# Patient Record
Sex: Female | Born: 1950 | Race: Black or African American | Hispanic: No | Smoking: Never smoker
Health system: Southern US, Community
[De-identification: ages and names within clinical notes are randomized; demographics above are authoritative.]

## PROBLEM LIST (undated history)

## (undated) DIAGNOSIS — G4730 Sleep apnea, unspecified: Secondary | ICD-10-CM

## (undated) DIAGNOSIS — F329 Major depressive disorder, single episode, unspecified: Secondary | ICD-10-CM

## (undated) DIAGNOSIS — F32A Depression, unspecified: Secondary | ICD-10-CM

## (undated) DIAGNOSIS — G709 Myoneural disorder, unspecified: Secondary | ICD-10-CM

## (undated) DIAGNOSIS — F419 Anxiety disorder, unspecified: Secondary | ICD-10-CM

## (undated) DIAGNOSIS — F1910 Other psychoactive substance abuse, uncomplicated: Secondary | ICD-10-CM

## (undated) DIAGNOSIS — I10 Essential (primary) hypertension: Secondary | ICD-10-CM

## (undated) DIAGNOSIS — B958 Unspecified staphylococcus as the cause of diseases classified elsewhere: Secondary | ICD-10-CM

## (undated) DIAGNOSIS — H269 Unspecified cataract: Secondary | ICD-10-CM

## (undated) DIAGNOSIS — K219 Gastro-esophageal reflux disease without esophagitis: Secondary | ICD-10-CM

## (undated) DIAGNOSIS — M1990 Unspecified osteoarthritis, unspecified site: Secondary | ICD-10-CM

## (undated) DIAGNOSIS — N730 Acute parametritis and pelvic cellulitis: Secondary | ICD-10-CM

## (undated) DIAGNOSIS — K759 Inflammatory liver disease, unspecified: Secondary | ICD-10-CM

## (undated) HISTORY — DX: Myoneural disorder, unspecified: G70.9

## (undated) HISTORY — PX: POLYPECTOMY: SHX149

## (undated) HISTORY — PX: CHOLECYSTECTOMY: SHX55

## (undated) HISTORY — DX: Sleep apnea, unspecified: G47.30

## (undated) HISTORY — DX: Depression, unspecified: F32.A

## (undated) HISTORY — PX: APPENDECTOMY: SHX54

## (undated) HISTORY — DX: Other psychoactive substance abuse, uncomplicated: F19.10

## (undated) HISTORY — DX: Gastro-esophageal reflux disease without esophagitis: K21.9

## (undated) HISTORY — DX: Inflammatory liver disease, unspecified: K75.9

## (undated) HISTORY — DX: Unspecified cataract: H26.9

## (undated) HISTORY — DX: Anxiety disorder, unspecified: F41.9

## (undated) HISTORY — PX: OTHER SURGICAL HISTORY: SHX169

## (undated) HISTORY — PX: COLONOSCOPY: SHX174

## (undated) HISTORY — PX: CYST REMOVAL NECK: SHX6281

## (undated) HISTORY — DX: Unspecified staphylococcus as the cause of diseases classified elsewhere: B95.8

## (undated) HISTORY — DX: Unspecified osteoarthritis, unspecified site: M19.90

---
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
## 2010-10-17 ENCOUNTER — Inpatient Hospital Stay (HOSPITAL_COMMUNITY): Admission: EM | Admit: 2010-10-17 | Discharge: 2010-10-18 | Payer: Self-pay | Admitting: Emergency Medicine

## 2010-10-17 ENCOUNTER — Encounter (INDEPENDENT_AMBULATORY_CARE_PROVIDER_SITE_OTHER): Payer: Self-pay

## 2011-01-30 LAB — DIFFERENTIAL
Basophils Absolute: 0 10*3/uL (ref 0.0–0.1)
Eosinophils Relative: 0 % (ref 0–5)
Lymphocytes Relative: 12 % (ref 12–46)
Lymphs Abs: 1.9 10*3/uL (ref 0.7–4.0)
Neutro Abs: 12.9 10*3/uL — ABNORMAL HIGH (ref 1.7–7.7)

## 2011-01-30 LAB — URINALYSIS, ROUTINE W REFLEX MICROSCOPIC
Bilirubin Urine: NEGATIVE
Hgb urine dipstick: NEGATIVE
Ketones, ur: NEGATIVE mg/dL
Nitrite: NEGATIVE
Urobilinogen, UA: 0.2 mg/dL (ref 0.0–1.0)
pH: 8.5 — ABNORMAL HIGH (ref 5.0–8.0)

## 2011-01-30 LAB — POCT I-STAT, CHEM 8
BUN: 11 mg/dL (ref 6–23)
Chloride: 105 mEq/L (ref 96–112)
Creatinine, Ser: 0.8 mg/dL (ref 0.4–1.2)
Potassium: 4 mEq/L (ref 3.5–5.1)
Sodium: 138 mEq/L (ref 135–145)
TCO2: 28 mmol/L (ref 0–100)

## 2011-01-30 LAB — CBC
MCV: 84.7 fL (ref 78.0–100.0)
Platelets: 276 10*3/uL (ref 150–400)
RBC: 4.65 MIL/uL (ref 3.87–5.11)
WBC: 15.6 10*3/uL — ABNORMAL HIGH (ref 4.0–10.5)

## 2011-01-30 LAB — URINE MICROSCOPIC-ADD ON

## 2011-10-30 ENCOUNTER — Emergency Department (HOSPITAL_COMMUNITY): Payer: Self-pay

## 2011-10-30 ENCOUNTER — Emergency Department (HOSPITAL_COMMUNITY)
Admission: EM | Admit: 2011-10-30 | Discharge: 2011-10-30 | Disposition: A | Discharge status: Home / Self Care | Payer: Self-pay | Attending: Emergency Medicine | Admitting: Emergency Medicine

## 2011-10-30 ENCOUNTER — Encounter: Payer: Self-pay | Admitting: *Deleted

## 2011-10-30 DIAGNOSIS — R946 Abnormal results of thyroid function studies: Secondary | ICD-10-CM | POA: Insufficient documentation

## 2011-10-30 DIAGNOSIS — X500XXA Overexertion from strenuous movement or load, initial encounter: Secondary | ICD-10-CM | POA: Insufficient documentation

## 2011-10-30 DIAGNOSIS — J45909 Unspecified asthma, uncomplicated: Secondary | ICD-10-CM | POA: Insufficient documentation

## 2011-10-30 DIAGNOSIS — M79609 Pain in unspecified limb: Secondary | ICD-10-CM | POA: Insufficient documentation

## 2011-10-30 DIAGNOSIS — I10 Essential (primary) hypertension: Secondary | ICD-10-CM | POA: Insufficient documentation

## 2011-10-30 DIAGNOSIS — S76019A Strain of muscle, fascia and tendon of unspecified hip, initial encounter: Secondary | ICD-10-CM

## 2011-10-30 DIAGNOSIS — IMO0002 Reserved for concepts with insufficient information to code with codable children: Secondary | ICD-10-CM | POA: Insufficient documentation

## 2011-10-30 HISTORY — DX: Essential (primary) hypertension: I10

## 2011-10-30 HISTORY — DX: Acute parametritis and pelvic cellulitis: N73.0

## 2011-10-30 MED ORDER — METHOCARBAMOL 500 MG PO TABS: 500.0000 mg | ORAL_TABLET | Freq: Two times a day (BID) | ORAL | Status: AC | PRN

## 2011-10-30 MED ORDER — KETOROLAC TROMETHAMINE 60 MG/2ML IM SOLN
60.0000 mg | Freq: Once | INTRAMUSCULAR | Status: AC
Start: 2011-10-30 — End: 2011-10-30
  Administered 2011-10-30: 60 mg via INTRAMUSCULAR
  Filled 2011-10-30: qty 2

## 2011-10-30 MED ORDER — NAPROXEN 500 MG PO TABS: 500.0000 mg | ORAL_TABLET | Freq: Two times a day (BID) | ORAL | Status: AC

## 2011-10-30 NOTE — ED Notes (Signed)
Pt. Discharged to home via w/c pt. Alert and oriented, NAD noted 

## 2011-10-30 NOTE — ED Provider Notes (Signed)
History     CSN: [id number] Arrival date & time: 10/30/2011  1:20 AM   First MD Initiated Contact with Patient 10/30/11 0203      Chief Complaint  Patient presents with  . Hip Pain    injury from different position during activity.     (Consider location/radiation/quality/duration/timing/severity/associated sxs/prior treatment) HPI Comments: 60 year old female with a history of right hip pain. This started approximately 3 days ago as the patient was trying out a "new sexual position". She states that her left leg was on the bed her right foot was on the floor and while she was bending over she felt acute onset of right thigh pain located in the right inguinal area radiating to her right anterior leg. Symptoms are constant, worse with moving the leg, better when holding her leg still. She has tried over-the-counter medications without any relief. She is walking with a cane. Symptoms are moderate, not associated with numbness or weakness, worse with ambulation. They were acute in onset  The history is provided by the patient.    Past Medical History  Diagnosis Date  . Hypertension   . Asthma   . PID (acute pelvic inflammatory disease)     Past Surgical History  Procedure Date  . Appendectomy   . Cholecystectomy   . Cesarean section     Family History  Problem Relation Age of Onset  . Stroke Mother   . Hypertension Mother   . Diabetes Other     History  Substance Use Topics  . Smoking status: Never Smoker   . Smokeless tobacco: Not on file  . Alcohol Use: Yes     rare/social    OB History    Grav Para Term Preterm Abortions TAB SAB Ect Mult Living                  Review of Systems  Gastrointestinal: Negative for nausea and vomiting.  Musculoskeletal: Positive for gait problem. Negative for back pain and joint swelling.  Neurological: Negative for weakness and numbness.    Allergies  Sulfur  Home Medications   Current Outpatient Rx  Name Route Sig  Dispense Refill  . ALPRAZOLAM 2 MG PO TABS Oral Take 2 mg by mouth at bedtime.      . ASPIRIN 81 MG PO CHEW Oral Chew 81 mg by mouth daily.      Marland Kitchen HYDROCHLOROTHIAZIDE 25 MG PO TABS Oral Take 25 mg by mouth daily.      Marland Kitchen PERPHENAZINE 8 MG PO TABS Oral Take 24 mg by mouth daily.      . SERTRALINE HCL 100 MG PO TABS Oral Take 150 mg by mouth daily.      . TRAZODONE HCL 300 MG PO TABS Oral Take 300 mg by mouth at bedtime.      . METHOCARBAMOL 500 MG PO TABS Oral Take 1 tablet (500 mg total) by mouth 2 (two) times daily as needed. 20 tablet 0  . NAPROXEN 500 MG PO TABS Oral Take 1 tablet (500 mg total) by mouth 2 (two) times daily with a meal. 30 tablet 0    BP 135/85  Pulse 74  Temp(Src) 98.1 F (36.7 C) (Oral)  Resp 18  SpO2 97%  Physical Exam  Nursing note and vitals reviewed. Constitutional: She appears well-developed and well-nourished. No distress.  HENT:  Head: Normocephalic and atraumatic.  Eyes: Conjunctivae are normal. No scleral icterus.  Cardiovascular: Normal rate, regular rhythm and intact distal pulses.   Pulmonary/Chest:  Effort normal and breath sounds normal.  Musculoskeletal: She exhibits tenderness ( Tenderness to palpation in the right lateral and anterior proximal thigh. A with range of motion of the hip minimally, able to straight leg raise and bend the knee with minimal difficulty). She exhibits no edema.  Neurological: She is alert.  Skin: Skin is warm and dry. No rash noted. She is not diaphoretic.    ED Course  Procedures (including critical care time)  Labs Reviewed - No data to display Dg Hip Complete Right  10/30/2011  *RADIOLOGY REPORT*  Clinical Data: Right hip injury with pain.  RIGHT HIP - COMPLETE 2+ VIEW  Comparison: None.  Findings: Frontal pelvis shows no fracture.  SI joints and symphysis pubis are normal.  Joint space in the hips is preserved.  AP and frog-leg lateral views of the right hip are normal.  IMPRESSION: No acute bony findings.   Original Report Authenticated By: ERIC A. MANSELL, M.D.     1. Strain of hip       MDM  Likely has a muscle strain of the right hip, has no apparent fracture on x-ray, was treated with intramuscular Toradol in home with her muscle relaxer anti-inflammatories. Patient informed of results        Vida Roller, MD 10/30/11 [phone]

## 2011-10-30 NOTE — ED Notes (Signed)
Trying a new sex position, over extended R hip, c/o R hip pain, "feels like it is broken", onset sudnay morning, "getting worse, not getting better", decreased ROM d/t R hip/thigh pain. No meds PTA.

## 2013-03-07 ENCOUNTER — Encounter (HOSPITAL_COMMUNITY): Payer: Self-pay | Admitting: Emergency Medicine

## 2013-03-07 ENCOUNTER — Emergency Department (HOSPITAL_COMMUNITY)
Admission: EM | Admit: 2013-03-07 | Discharge: 2013-03-07 | Disposition: A | Discharge status: Home / Self Care | Payer: Self-pay | Attending: Emergency Medicine | Admitting: Emergency Medicine

## 2013-03-07 DIAGNOSIS — Z7982 Long term (current) use of aspirin: Secondary | ICD-10-CM | POA: Insufficient documentation

## 2013-03-07 DIAGNOSIS — K0889 Other specified disorders of teeth and supporting structures: Secondary | ICD-10-CM

## 2013-03-07 DIAGNOSIS — J45909 Unspecified asthma, uncomplicated: Secondary | ICD-10-CM | POA: Insufficient documentation

## 2013-03-07 DIAGNOSIS — K08109 Complete loss of teeth, unspecified cause, unspecified class: Secondary | ICD-10-CM | POA: Insufficient documentation

## 2013-03-07 DIAGNOSIS — Z79899 Other long term (current) drug therapy: Secondary | ICD-10-CM | POA: Insufficient documentation

## 2013-03-07 DIAGNOSIS — I10 Essential (primary) hypertension: Secondary | ICD-10-CM | POA: Insufficient documentation

## 2013-03-07 DIAGNOSIS — K089 Disorder of teeth and supporting structures, unspecified: Secondary | ICD-10-CM | POA: Insufficient documentation

## 2013-03-07 MED ORDER — PENICILLIN V POTASSIUM 500 MG PO TABS: ORAL_TABLET | ORAL | Status: DC

## 2013-03-07 MED ORDER — HYDROCODONE-ACETAMINOPHEN 5-325 MG PO TABS
1.0000 | ORAL_TABLET | Freq: Once | ORAL | Status: AC
  Administered 2013-03-07: 1 via ORAL
  Filled 2013-03-07: qty 1

## 2013-03-07 MED ORDER — HYDROCODONE-ACETAMINOPHEN 5-325 MG PO TABS: 1.0000 | ORAL_TABLET | Freq: Four times a day (QID) | ORAL | Status: DC | PRN

## 2013-03-07 NOTE — ED Provider Notes (Signed)
History    This chart was scribed for non-physician practitioner Raymon Mutton working with Ward Givens, MD by Quintella Reichert, ED Scribe. This patient was seen in room TR06C/TR06C and the patient's care was started at 3:06 PM .   CSN: [id number]  Arrival date & time 03/07/13  1305      Chief Complaint  Patient presents with  . Dental Pain     The history is provided by the patient. No language interpreter was used.   Laura Castro is a 62 y.o. female who presents to the Emergency Department complaining of constant, bilateral lower dental pain subsequent to having 8 teeth pulled on 02/09/13 weeks ago at [facility].  She also reports that she can feel "bones" protruding in the area of excision.  She states that pain is exacerbated by sweet substances and hot and cold fluids, but slightly relieved by warm fluids.  Pain is non-radiating.  Pt has prior h/o having teeth removed, but states that subsequent pain never lasted as long as presently.  She denies facial swelling or visible redness, drainage from the site, cysts in the mouth, difficulty swallowing, CP, SOB, difficulty breathing, fever, chills, dizziness, visual changes, or tinnitus.  She states was medicating for several weeks with illegally-obtained hydrocodone and oxycodone, but states she stopped yesterday.  Pt also reports she has had laryngitis for 2 years, and has had cataract surgery in right eye.  She also reports h/o chronic neck pain, which is currently at baseline.   Past Medical History  Diagnosis Date  . Hypertension   . Asthma   . PID (acute pelvic inflammatory disease)     Past Surgical History  Procedure Laterality Date  . Appendectomy    . Cholecystectomy    . Cesarean section      Family History  Problem Relation Age of Onset  . Stroke Mother   . Hypertension Mother   . Diabetes Other     History  Substance Use Topics  . Smoking status: Never Smoker   . Smokeless tobacco: Not on file  .  Alcohol Use: Yes     Comment: rare/social    OB History   Grav Para Term Preterm Abortions TAB SAB Ect Mult Living                  Review of Systems  Constitutional: Negative for chills.  HENT: Positive for dental problem. Negative for mouth sores and tinnitus.   Eyes: Negative for visual disturbance.  Cardiovascular: Negative for chest pain.  Neurological: Negative for dizziness.  All other systems reviewed and are negative.     Allergies  Sulfur  Home Medications   Current Outpatient Rx  Name  Route  Sig  Dispense  Refill  . albuterol (PROVENTIL HFA;VENTOLIN HFA) 108 (90 BASE) MCG/ACT inhaler   Inhalation   Inhale 2 puffs into the lungs every 6 (six) hours as needed for wheezing.         Marland Kitchen aspirin 81 MG chewable tablet   Oral   Chew 81 mg by mouth daily.           . busPIRone (BUSPAR) 15 MG tablet   Oral   Take 15 mg by mouth 4 (four) times daily.         . hydrochlorothiazide (HYDRODIURIL) 25 MG tablet   Oral   Take 25 mg by mouth daily.           Marland Kitchen LORazepam (ATIVAN) 2 MG tablet  Oral   Take 2 mg by mouth 2 (two) times daily as needed for anxiety.         Marland Kitchen perphenazine (TRILAFON) 16 MG tablet   Oral   Take 32 mg by mouth daily.         . sertraline (ZOLOFT) 100 MG tablet   Oral   Take 150 mg by mouth daily.           Marland Kitchen HYDROcodone-acetaminophen (NORCO) 5-325 MG per tablet   Oral   Take 1 tablet by mouth every 6 (six) hours as needed for pain.   6 tablet   0   . penicillin v potassium (VEETID) 500 MG tablet      Take 1 tablet PO BID x 10 days   20 tablet   0     BP 119/69  Pulse 78  Temp(Src) 98.8 F (37.1 C) (Oral)  Resp 18  SpO2 95%  Physical Exam  Nursing note and vitals reviewed. Constitutional: She is oriented to person, place, and time. She appears well-developed and well-nourished. No distress.  HENT:  Head: Normocephalic and atraumatic.  Mouth/Throat: Oropharynx is clear and moist. No oropharyngeal exudate.     No facial swelling, inflammation, erythema, deformities noted.  Mouth:No teeth to upper and lower jaw.  Left anterior aspect of lower jaw: mild tooth fragment remaining from tooth extraction noted - no inflammation, swelling, erythema, abscess, cyst, drainage noted to site.  No lesions or sores noted.  Positive discomfort upon palpation with tongue depressor.  Eyes: Conjunctivae and EOM are normal. Pupils are equal, round, and reactive to light. Right eye exhibits no discharge. Left eye exhibits no discharge.  Neck: Normal range of motion. Neck supple. No tracheal deviation present.  Negative lymphadenopathy  Cardiovascular: Normal rate, regular rhythm and normal heart sounds.   No murmur heard. Radial pulses 2+ bilaterally.  Pulmonary/Chest: Effort normal and breath sounds normal. No respiratory distress. She has no wheezes. She has no rales.  Musculoskeletal: Normal range of motion. She exhibits no tenderness.  Lymphadenopathy:    She has no cervical adenopathy.  Neurological: She is alert and oriented to person, place, and time. She exhibits normal muscle tone. Coordination normal.  Skin: Skin is warm and dry. No rash noted. No erythema.  Psychiatric: She has a normal mood and affect. Her behavior is normal. Thought content normal.     ED Course  Procedures (including critical care time)  DIAGNOSTIC STUDIES: Oxygen Saturation is 95% on room air, adequate by my interpretation.    COORDINATION OF CARE: 3:19 PM-Discussed likelihood that symptoms are due to tooth fragment that was not successfully excised, and explained treatment plan which includes pain medication and antibiotics and f/u at dental clinic with pt at bedside and pt agreed to plan.        Labs Reviewed - No data to display No results found.   1. Pain, dental       MDM  I personally performed the services described in this documentation, which was scribed in my presence. The recorded information has been  reviewed and is accurate.   Patient afebrile, normotensive, non-tachycardic alert and oriented. Tooth fragments remain in lower left anterior aspect of mandibular gumline. No sign of infection noted, no sublingual lesion or trismus noted - r/o Ludwig's angina. Patient aseptic, non-toxic appearing, in no acute distress. Discharged patient. Discharged patient with antibiotics for infection prophylaxis and short course of pain medications. Recommended patient to follow-up with the [place] in [place] to  get re-check by dentist. Discussed with patient to stay hydrated. Discussed with patient to not use illicit, illegal drugs - explained potential consequences that could pursue. Discussed with patient to monitor symptoms and if symptoms worsen or change to report back to the ED. Patient agreed to plan of care, understood, all questions answered.   Raymon Mutton, PA-C 03/08/13 1054 ED provider identified for [facility], [place] 03/09/13 1824

## 2013-03-07 NOTE — Discharge Instructions (Signed)
Please follow-up with [facility] this week regarding tooth pain and possible tooth fragments left behind from surgery.  Please take medications as prescribed. While on pain medications there is to be no drinking alcohol, driving, operating heavy machinery. Please apply ice to site to aid in pain relief. Please monitor symptoms, if worsen or change (swelling, fever, chills, drainage, abscess formation) report back to the ED.    Dental Pain A tooth ache may be caused by cavities (tooth decay). Cavities expose the nerve of the tooth to air and hot or cold temperatures. It may come from an infection or abscess (also called a boil or furuncle) around your tooth. It is also often caused by dental caries (tooth decay). This causes the pain you are having. DIAGNOSIS  Your caregiver can diagnose this problem by exam. TREATMENT   If caused by an infection, it may be treated with medications which kill germs (antibiotics) and pain medications as prescribed by your caregiver. Take medications as directed.  Only take over-the-counter or prescription medicines for pain, discomfort, or fever as directed by your caregiver.  Whether the tooth ache today is caused by infection or dental disease, you should see your dentist as soon as possible for further care. SEEK MEDICAL CARE IF: The exam and treatment you received today has been provided on an emergency basis only. This is not a substitute for complete medical or dental care. If your problem worsens or new problems (symptoms) appear, and you are unable to meet with your dentist, call or return to this location. SEEK IMMEDIATE MEDICAL CARE IF:   You have a fever.  You develop redness and swelling of your face, jaw, or neck.  You are unable to open your mouth.  You have severe pain uncontrolled by pain medicine. MAKE SURE YOU:   Understand these instructions.  Will watch your condition.  Will get help right away if you are not  doing well or get worse. Document Released: 11/04/2005 Document Revised: 01/27/2012 Document Reviewed: 06/22/2008 [facility] Patient Information 2013 [place], [place].  RESOURCE GUIDE  Chronic Pain Problems: Contact [facility]  [phone] Patients need to be referred by their primary care doctor.  Insufficient Money for Medicine: Contact United Way:  call "211."   No Primary Care Doctor: - Call Health Connect  [phone] - can help you locate a primary care doctor that  accepts your insurance, provides certain services, etc. - Physician Referral Service- [phone]  Agencies that provide inexpensive medical care: - [facility]  [phone] - [facility]  [phone] - [facility]  [phone] - Women's Clinic  [phone] - Planned Parenthood  [phone] [facility]  [phone]  Medicaid-accepting [facility] Providers: - [facility]- [address]  [phone], Mon-Fri 9am-7pm, Sat 9am-1pm - [facility]- [address] [place], Suite [place]  [phone] - [facility]- [address], Suite [place]  [phone] [facility]- [address]  [phone] - Renaye Rakers- [address] [place], [address], [phone]  Only accepts [place] patients after they have their name  applied to their card  Self Pay (no insurance) in [place]: - Sickle Cell Patients: Dr Willey Blade, [facility] Internal [facility]  [address] [place], [phone] - [facility]- [address] [place]  [phone]       -     Patrcia Dolly  [facility] [place]- [address]       -     [facility]- see information above (Speak to [employer] if you do not have insurance)       -  [employer]- [address],  [phone]       -  [facility]- [address] [phone]       -  Dr Julio Sicks-  [address], [address], [place], [phone]       -  [facility]gent [facility] and [facility] - [address] [phone]       -  [facility]- [address], [phone], also [address] [phone]       -    [facility]- [address] [place], [phone], 1st & 3rd Saturday        every month, 10am-1pm  [facility] [address] [place], [place] [address] [phone]  [facility] [address] [place], [place] [address] [phone]  1) Find a Doctor and Pay Out of Pocket Although you won't have to find out who is covered by your insurance plan, it is a good idea to ask around and get recommendations. You will then need to call the office and see if the doctor you have chosen will accept you as a new patient and what types of options they offer for patients who are self-pay. Some doctors offer discounts or will set up payment plans for their patients who do not have insurance, but you will need to ask so you aren't surprised when you get to your appointment.  2) Contact Your Local Health Department Not all health departments have doctors that can see patients for sick visits, but many do, so it is worth a call to see if yours does. If you don't know where your local health department is, you can check in your phone book. The CDC also has a tool to help you locate your state's health department, and many state websites also have listings of all of their local health departments.  3) Find a Walk-in Clinic If your illness is not likely to be very severe or complicated, you may want to try a walk in clinic. These are popping up all over the country in pharmacies, drugstores, and shopping centers. They're usually staffed by nurse practitioners or physician assistants that have been trained to treat common illnesses and complaints. They're usually fairly quick and inexpensive. However, if you have serious medical issues or chronic  medical problems, these are probably not your best option  STD Testing - [facility] Department of [facility] [place], STD Clinic, [address], [place], phone [phone] or [phone].  Monday - Friday, call for an appointment. [facility] Department of [employer], STD Clinic, [place] [address], [place], phone [phone] or [phone].  Monday - Friday, call for an appointment.  Abuse/Neglect: [facility] Child Abuse Hotline (671)326-2056 [facility] Child Abuse Hotline (760)685-9886 (After Hours)  Emergency Shelter:  [facility] 661-483-8802  Maternity Homes: - [facility] (336) [phone] - [facility] [phone]  MRSA Hotline #:   989-551-5871  Dental Assistance If unable to pay or uninsured, contact:  [facility]. to become qualified for the adult dental clinic.  Patients with Medicaid: [facility]  [facility] [place] [facility] [phone] [address], [phone] [address] [phone]  If unable to pay, or uninsured, contact [facility] [phone] in [place], [phone] in [facility]) to become qualified for the adult dental clinic  [facility] [address] [place], [place] [address] [phone] [email or web address]  Other Proofreader Services: - [facility]- [address] [place], [place], [place], [address], [phone], Ext. [phone], 2nd and 4th Thursday of the month at 6:30am.  10 clients each day by appointment, can sometimes see walk-in patients if someone does not show for an appointment. [facility]- [address] [place], [place], [address], [phone] - [facility] [address] [place], [place], [address], [phone] - [facility]- [phone] [facility]- [phone] [facility] Health [phone]       Behavioral  Health Resources in the [employer] [facility]  Intensive Outpatient Programs: [facility]      [address] [place], [place] [phone] Both a day and evening program       [facility] Outpatient     [address]        [place], [place] [address] [phone]         ADS: [facility] Drug S[facility] [address] [place] [phone]  [facility] Mental Health ACCESS LINE: (843)705-2924 or 3185345325 [address] [place], [place] [address] [email or web address]   Substance Abuse Resources: - Alcohol and Drug Services  [phone] - [facility] [phone] - [facility] [place] [phone] Floydene Flock [phone] - Residential & Outpatient Substance Abuse Program  8501209274  Psychological Services: [facility]  [phone] [facility] Services  [phone] - [facility], [phone] [place]. [address] [place], ACCESS LINE: 4758086834 or [phone], [email or web address]  Mobile Crisis Teams:                                        Therapeutic Alternatives         Mobile Crisis Care Unit [phone]             [facility] [address] Ginette Otto [phone]                                         Interventionist [address] [place] [address] [place] [phone]  Self-Help/Support Groups: Mental Health Assoc. of [employer] of support groups (941)423-1866 (call for more info)   Narcotics Anonymous (NA) Caring Services [address] [place] - 2 meetings at this location  Residential Treatment Programs:  [facility]      [address]        [place]       [phone]         [facility] [address], [place] [address] [place], [place]  [address] [phone]  [facility]  [address]  [place], [place] [address] [phone] Admissions: 8am-3pm M-F  [facility]     [address]        [place], [place] [address]       [phone]         [facility]  [address] [place], [place] [phone]  [facility] [address] [place], [place] [phone]  [facility] - Intensive Outpatient      [address]     [place], [place]       [phone]         [facility] (Addiction Recovery Care [facility]oc.)     [address] [place], [place] [phone] or [phone]  Residential Treatment Services (RTS), Medicaid [address] [place], [place] [phone]  [facility] [address]                                               [address] [employer] [place] [phone]  [facility] Resources: [phone]               General Therapy                                                Angie Fava, PhD        [address] [place], [place] [address]         [phone]   Insurance  [facility] Behavioral   [address] [place], [place] [address] [phone]  [facility] Recovery [address] [place], [place] [address] [phone] Insurance/Medicaid/sponsorship through [facility] and Families                                              [address]                                        [place], [place] [address]    Therapy/tele-psych/case         [phone]          [facility] [address] [place] [address] [place], [place]  [address]  Adolescent/group home/case management [phone]                                           Creola Corn PhD       General therapy       Insurance   [phone]         Dr. Lolly Mustache, [place], M-F [phone]  [facility]  United Way [facility] Dept. [address]                 [address]         [address] [place] [address]   [address]  Cristobal Goldmann Phone:  [phone]                                  Phone:  [phone]                   Phone:  [phone]  [facility], [phone] - [facility] - [facility]- [phone]       -     [facility] in [place], [address]             [phone], [facility] Child Abuse Hotline (778)471-5286 or [phone] (After Hours)

## 2013-03-07 NOTE — ED Notes (Signed)
Pt c/o dental pain after having 8 teeth pulled 2 weeks ago

## 2013-03-10 NOTE — ED Provider Notes (Signed)
Medical screening examination/treatment/procedure(s) were performed by non-physician practitioner and as supervising physician I was immediately available for consultation/collaboration. Presli Fanguy, MD, FACEP   Andromeda Poppen L Mysty Kielty, MD 03/10/13 1101 

## 2016-02-05 ENCOUNTER — Encounter (HOSPITAL_COMMUNITY): Payer: Self-pay | Admitting: Emergency Medicine

## 2016-02-05 ENCOUNTER — Emergency Department (HOSPITAL_COMMUNITY): Payer: Self-pay

## 2016-02-05 DIAGNOSIS — Z79899 Other long term (current) drug therapy: Secondary | ICD-10-CM | POA: Insufficient documentation

## 2016-02-05 DIAGNOSIS — J45909 Unspecified asthma, uncomplicated: Secondary | ICD-10-CM | POA: Insufficient documentation

## 2016-02-05 DIAGNOSIS — J029 Acute pharyngitis, unspecified: Secondary | ICD-10-CM | POA: Insufficient documentation

## 2016-02-05 DIAGNOSIS — Z8742 Personal history of other diseases of the female genital tract: Secondary | ICD-10-CM | POA: Insufficient documentation

## 2016-02-05 DIAGNOSIS — I10 Essential (primary) hypertension: Secondary | ICD-10-CM | POA: Insufficient documentation

## 2016-02-05 DIAGNOSIS — R51 Headache: Secondary | ICD-10-CM | POA: Insufficient documentation

## 2016-02-05 DIAGNOSIS — Z7982 Long term (current) use of aspirin: Secondary | ICD-10-CM | POA: Insufficient documentation

## 2016-02-05 NOTE — ED Notes (Signed)
Pt sts cough x 2 days with pain with cough; pt sts non productive

## 2016-02-06 ENCOUNTER — Emergency Department (HOSPITAL_COMMUNITY)
Admission: EM | Admit: 2016-02-06 | Discharge: 2016-02-06 | Disposition: A | Discharge status: Home / Self Care | Payer: Self-pay | Attending: Emergency Medicine | Admitting: Emergency Medicine

## 2016-02-06 DIAGNOSIS — J209 Acute bronchitis, unspecified: Secondary | ICD-10-CM

## 2016-02-06 MED ORDER — HYDROCOD POLST-CPM POLST ER 10-8 MG/5ML PO SUER: 5.0000 mL | Freq: Every evening | ORAL | Status: DC | PRN

## 2016-02-06 MED ORDER — OXYCODONE-ACETAMINOPHEN 5-325 MG PO TABS
1.0000 | ORAL_TABLET | Freq: Once | ORAL | Status: AC
  Administered 2016-02-06: 1 via ORAL
  Filled 2016-02-06: qty 1

## 2016-02-06 MED ORDER — DEXAMETHASONE 4 MG PO TABS
8.0000 mg | ORAL_TABLET | Freq: Once | ORAL | Status: AC
  Administered 2016-02-06: 8 mg via ORAL
  Filled 2016-02-06: qty 2

## 2016-02-06 MED ORDER — ALBUTEROL SULFATE HFA 108 (90 BASE) MCG/ACT IN AERS
2.0000 | INHALATION_SPRAY | RESPIRATORY_TRACT | Status: DC
Start: 2016-02-06 — End: 2016-02-06
  Administered 2016-02-06: 2 via RESPIRATORY_TRACT
  Filled 2016-02-06: qty 6.7

## 2016-02-06 NOTE — ED Provider Notes (Signed)
CSN: [id number]     Arrival date & time 02/05/16  1812 History  By signing my name below, I, Iona BeardChristian Pulliam, attest that this documentation has been prepared under the direction and in the presence of No att. providers found.   Electronically Signed: Iona Beardhristian Pulliam, ED Scribe. 02/06/2016. 1:27 AM   Chief Complaint  Patient presents with  . Cough    Patient is a 65 y.o. female presenting with cough. The history is provided by the patient. No language interpreter was used.  Cough Cough characteristics:  Non-productive Severity:  Moderate Onset quality:  Gradual Duration:  2 days Timing:  Constant Progression:  Unchanged Chronicity:  New Smoker: no   Relieved by:  Nothing Worsened by:  Nothing tried Ineffective treatments:  None tried Associated symptoms: headaches and sore throat   Associated symptoms: no chest pain, no fever and no shortness of breath    HPI Comments: Laura Castro is a 65 y.o. female with PMHx of asthma and HTN who presents to the Emergency Department complaining of gradual onset, constant non-productive cough, onset two days ago. Pt reports associated headache and sore throat worsening with cough. No other worsening or alleviating factors noted. Pt denies fever, vomiting, diarrhea, abdominal pain, chest pain, leg swelling, shortness of breath, or any other pertinent symptoms. Pt wears cpap mask at night.   Past Medical History  Diagnosis Date  . Hypertension   . Asthma   . PID (acute pelvic inflammatory disease)    Past Surgical History  Procedure Laterality Date  . Appendectomy    . Cholecystectomy    . Cesarean section     Family History  Problem Relation Age of Onset  . Stroke Mother   . Hypertension Mother   . Diabetes Other    Social History  Substance Use Topics  . Smoking status: Never Smoker   . Smokeless tobacco: None  . Alcohol Use: Yes     Comment: rare/social   OB History    No data available     Review of Systems   Constitutional: Negative for fever.  HENT: Positive for sore throat.   Respiratory: Positive for cough. Negative for shortness of breath.   Cardiovascular: Negative for chest pain and leg swelling.  Gastrointestinal: Negative for vomiting, abdominal pain and diarrhea.  Neurological: Positive for headaches.  All other systems reviewed and are negative.  Allergies  Sulfur  Home Medications   Prior to Admission medications   Medication Sig Start Date End Date Taking? Authorizing Provider  albuterol (PROVENTIL HFA;VENTOLIN HFA) 108 (90 BASE) MCG/ACT inhaler Inhale 2 puffs into the lungs every 6 (six) hours as needed for wheezing.    Historical Provider, MD  aspirin 81 MG chewable tablet Chew 81 mg by mouth daily.      Historical Provider, MD  busPIRone (BUSPAR) 15 MG tablet Take 15 mg by mouth 4 (four) times daily.    Historical Provider, MD  hydrochlorothiazide (HYDRODIURIL) 25 MG tablet Take 25 mg by mouth daily.      Historical Provider, MD  HYDROcodone-acetaminophen (NORCO) 5-325 MG per tablet Take 1 tablet by mouth every 6 (six) hours as needed for pain. 03/07/13   Marissa Sciacca, PA-C  LORazepam (ATIVAN) 2 MG tablet Take 2 mg by mouth 2 (two) times daily as needed for anxiety.    Historical Provider, MD  penicillin v potassium (VEETID) 500 MG tablet Take 1 tablet PO BID x 10 days 03/07/13   Marissa Sciacca, PA-C  perphenazine (TRILAFON) 16 MG  tablet Take 32 mg by mouth daily.    Historical Provider, MD  sertraline (ZOLOFT) 100 MG tablet Take 150 mg by mouth daily.      Historical Provider, MD   BP 135/89 mmHg  Pulse 90  Temp(Src) 99.4 F (37.4 C) (Oral)  Resp 18  SpO2 95% Physical Exam CONSTITUTIONAL: Well developed/well nourished HEAD: Normocephalic/atraumatic EYES: EOMI/PERRL ENMT: Mucous membranes moist, uvula midline, no erythema, no exudates NECK: supple no meningeal signs CV: S1/S2 noted, no murmurs/rubs/gallops noted LUNGS: no apparent distress, scattered  wheezing ABDOMEN: soft, nontender, no rebound or guarding, bowel sounds noted throughout abdomen NEURO: Pt is awake/alert/appropriate, moves all extremitiesx4.  No facial droop.   EXTREMITIES: pulses normal/equal, full ROM SKIN: warm, color normal PSYCH: no abnormalities of mood noted, alert and oriented to situation  ED Course  Procedures  DIAGNOSTIC STUDIES: Oxygen Saturation is 95% on RA, adequate by my interpretation.    COORDINATION OF CARE: 1:43 AM-Discussed treatment plan which includes CXR, albuterol, percocet/roxicet, decadron with pt at bedside and pt agreed to plan.    Imaging Review Dg Chest 2 View  02/05/2016  CLINICAL DATA:  Cough, shortness of breath, fever, and sore throat for 3 days. EXAM: CHEST  2 VIEW COMPARISON:  None. FINDINGS: The cardiac silhouette is upper limits of normal in size. The lungs are well inflated. There is mild-to-moderate airway thickening bilaterally. No confluent airspace opacity, edema, pleural effusion, or pneumothorax is identified. No acute osseous abnormality is seen. Upper abdominal surgical clips are noted. IMPRESSION: Airway thickening which may reflect acute or chronic bronchitis. Electronically Signed   By: Sebastian Ache M.D.   On: 02/05/2016 21:30   I have personally reviewed and evaluated these images as part of my medical decision-making.  Medications  albuterol (PROVENTIL HFA;VENTOLIN HFA) 108 (90 Base) MCG/ACT inhaler 2 puff (2 puffs Inhalation Given 02/06/16 0148)  oxyCODONE-acetaminophen (PERCOCET/ROXICET) 5-325 MG per tablet 1 tablet (1 tablet Oral Given 02/06/16 0209)  dexamethasone (DECADRON) tablet 8 mg (8 mg Oral Given 02/06/16 0209)    Pt reports she is attempting f[facility]u at [facility] hospital She is well appearing I doubt CHF/PE or other acute emergency at this time  MDM   Final diagnoses:  Acute bronchitis, unspecified organism    Nursing notes including past medical history and social history reviewed and considered in  documentation xrays/imaging reviewed by myself and considered during evaluation   I personally performed the services described in this documentation, which was scribed in my presence. The recorded information has been reviewed and is accurate.     Zadie Rhine, MD 02/06/16 [id number]

## 2016-02-06 NOTE — Discharge Instructions (Signed)

## 2019-02-04 ENCOUNTER — Telehealth: Payer: Self-pay | Admitting: Gastroenterology

## 2019-02-04 NOTE — Telephone Encounter (Signed)
Pt was referred over for a colonoscopy from the [place]. DOD for 3.11.20 is Dr. Chales Abrahams. PT lives in [place] and would like to stay in [place]. Records sent to Dr. Adela Lank. Please advise for scheduling.

## 2019-04-07 ENCOUNTER — Encounter: Payer: Self-pay | Admitting: Gastroenterology

## 2019-04-08 ENCOUNTER — Encounter: Payer: Self-pay | Admitting: Gastroenterology

## 2019-05-06 ENCOUNTER — Telehealth: Payer: Self-pay

## 2019-05-06 NOTE — Telephone Encounter (Signed)
Patient is reschedule for 05/12/2019 @4 :30pm

## 2019-05-06 NOTE — Telephone Encounter (Signed)
Patient was scheduled for PV on 05/06/19 @ 10:00 Am. Patient was called three times but did not answer. I was not able to leave a message because the mailbox was full. If patient does not call to reschedule appointment by 5:00 Pm today the colonoscopy that is scheduled for 05/20/19 will be cancelled per [place] guidelines.

## 2019-05-12 ENCOUNTER — Ambulatory Visit: Payer: Self-pay | Admitting: *Deleted

## 2019-05-12 ENCOUNTER — Other Ambulatory Visit: Payer: Self-pay

## 2019-05-12 VITALS — Ht 64.0 in | Wt 272.0 lb

## 2019-05-12 DIAGNOSIS — Z86010 Personal history of colonic polyps: Secondary | ICD-10-CM

## 2019-05-12 MED ORDER — MOVIPREP 100 G PO SOLR
1.0000 | Freq: Once | ORAL | 0 refills | Status: AC
Start: 2019-05-12 — End: 2019-05-12

## 2019-05-12 NOTE — Progress Notes (Signed)
Telephone interview related to COVID 19 ID per name, DOB and address Patient is [facility]% [facility]. During interview patient absolutely refused [place] prep, said she used as a laxative in the hospital in the past.Patient with chronic constipation. Insistent on another prep, called in Team Leader Sundra Aland. Patient agreed to [facility], in the past the [place] has covered the prep.Patient to come to the #3rd floor office in the am and pick up the RX and packet of instructions. Patient will take RX to the [place] Patient knows that if not covered by the [place] that she will be responsible for coverage. Patient knows that if she is not cleaned out for the procedure it will be cancelled and rescheduled. No egg or soy allergy known to patient  No issues with past sedation with any surgeries  or procedures, no intubation problems  No diet pills per patient No home 02 use per patient  No blood thinners per patient  No A fib or A flutter  EMMI informationin packet

## 2019-05-19 ENCOUNTER — Telehealth: Payer: Self-pay | Admitting: Gastroenterology

## 2019-05-19 NOTE — Telephone Encounter (Signed)

## 2019-05-20 ENCOUNTER — Other Ambulatory Visit: Payer: Self-pay

## 2019-05-20 ENCOUNTER — Encounter: Payer: Self-pay | Admitting: Gastroenterology

## 2019-05-20 ENCOUNTER — Telehealth: Payer: Self-pay | Admitting: Gastroenterology

## 2019-07-09 ENCOUNTER — Other Ambulatory Visit: Payer: Self-pay

## 2019-07-09 ENCOUNTER — Ambulatory Visit (AMBULATORY_SURGERY_CENTER): Payer: Self-pay | Admitting: *Deleted

## 2019-07-09 VITALS — Temp 96.8°F | Ht 64.0 in | Wt 257.4 lb

## 2019-07-09 DIAGNOSIS — Z86010 Personal history of colonic polyps: Secondary | ICD-10-CM

## 2019-07-09 MED ORDER — MOVIPREP 100 G PO SOLR: 1.0000 | Freq: Once | ORAL | 0 refills | Status: AC

## 2019-07-09 NOTE — Progress Notes (Addendum)
No egg or soy allergy known to patient  No issues with past sedation with any surgeries  or procedures, no intubation problems  No diet pills per patient No home 02 use per patient  No blood thinners per patient   Pt denies issues with constipation- pt states she stools daily to EOD and are soft and regular - occ runny  - last scheduled colon pt called and canceled as she stated she was not cleaned out- I asked her about this- she stated she did not drink the 2nd Movi prep as she didn't know what time to drink it- I did a 2 day Movi prep today- we discussed instructions several times over wed- fri and she verbalized understanding however I encouraged her to call with any and every questions she may have   No A fib or A flutter  EMMI video declined  Movi prep script to pt for the [place]

## 2019-07-12 ENCOUNTER — Telehealth: Payer: Self-pay | Admitting: Gastroenterology

## 2019-07-12 MED ORDER — POLYETHYLENE GLYCOL 3350 17 GM/SCOOP PO POWD: 1.0000 | Freq: Every day | ORAL | 0 refills | Status: DC

## 2019-07-12 MED ORDER — DULCOLAX 5 MG PO TBEC: 5.0000 mg | DELAYED_RELEASE_TABLET | Freq: Once | ORAL | 0 refills | Status: DC

## 2019-07-12 NOTE — Telephone Encounter (Signed)
Pt stated that pharmacy requests a prescription for Miralax and dulcolax,

## 2019-07-12 NOTE — Telephone Encounter (Signed)
Returned patients call. A Rx for Miralax and Dulcolax was written so that the patient could take it to the [place]. Patient will pick up the Rx at the 3rd floor front desk today.   Riki Sheer, LPN ( PV )

## 2019-07-22 ENCOUNTER — Telehealth: Payer: Self-pay

## 2019-07-22 NOTE — Telephone Encounter (Signed)
Covid-19 screening questions   Do you now or have you had a fever in the last 14 days? NO   Do you have any respiratory symptoms of shortness of breath or cough now or in the last 14 days? NO, just the usual cough she has had for years.   Do you have any family members or close contacts with diagnosed or suspected Covid-19 in the past 14 days? NO   Have you been tested for Covid-19 and found to be positive? NO

## 2019-07-23 ENCOUNTER — Other Ambulatory Visit: Payer: Self-pay

## 2019-07-23 ENCOUNTER — Ambulatory Visit (AMBULATORY_SURGERY_CENTER): Payer: No Typology Code available for payment source | Admitting: Gastroenterology

## 2019-07-23 ENCOUNTER — Encounter: Payer: Self-pay | Admitting: Gastroenterology

## 2019-07-23 VITALS — BP 129/93 | HR 78 | Temp 98.3°F | Resp 16 | Ht 64.0 in | Wt 257.0 lb

## 2019-07-23 DIAGNOSIS — K6289 Other specified diseases of anus and rectum: Secondary | ICD-10-CM

## 2019-07-23 DIAGNOSIS — Z86010 Personal history of colonic polyps: Secondary | ICD-10-CM

## 2019-07-23 DIAGNOSIS — K620 Anal polyp: Secondary | ICD-10-CM | POA: Diagnosis not present

## 2019-07-23 MED ORDER — SODIUM CHLORIDE 0.9 % IV SOLN: 500.0000 mL | Freq: Once | INTRAVENOUS | Status: DC

## 2019-07-23 NOTE — Progress Notes (Signed)
Temperature taken by J.B., VS taken by C.W. 

## 2019-07-23 NOTE — Progress Notes (Signed)
Called to room to assist during endoscopic procedure.  Patient ID and intended procedure confirmed with present staff. Received instructions for my participation in the procedure from the performing physician.  

## 2019-07-23 NOTE — Progress Notes (Signed)
Pt's states no medical or surgical changes since previsit or office visit. 

## 2019-07-23 NOTE — Patient Instructions (Signed)
HANDOUTS ; HEMORRHOIDS AND DIVERTICULOSIS    YOU HAD AN ENDOSCOPIC PROCEDURE TODAY AT [facility]:   Refer to the procedure report that was given to you for any specific questions about what was found during the examination.  If the procedure report does not answer your questions, please call your gastroenterologist to clarify.  If you requested that your care partner not be given the details of your procedure findings, then the procedure report has been included in a sealed envelope for you to review at your convenience later.  YOU SHOULD EXPECT: Some feelings of bloating in the abdomen. Passage of more gas than usual.  Walking can help get rid of the air that was put into your GI tract during the procedure and reduce the bloating. If you had a lower endoscopy (such as a colonoscopy or flexible sigmoidoscopy) you may notice spotting of blood in your stool or on the toilet paper. If you underwent a bowel prep for your procedure, you may not have a normal bowel movement for a few days.  Please Note:  You might notice some irritation and congestion in your nose or some drainage.  This is from the oxygen used during your procedure.  There is no need for concern and it should clear up in a day or so.  SYMPTOMS TO REPORT IMMEDIATELY:   Following lower endoscopy (colonoscopy or flexible sigmoidoscopy):  Excessive amounts of blood in the stool  Significant tenderness or worsening of abdominal pains  Swelling of the abdomen that is new, acute  Fever of 100F or higher   For urgent or emergent issues, a gastroenterologist can be reached at any hour by calling [phone].   DIET:  We do recommend a small meal at first, but then you may proceed to your regular diet.  Drink plenty of fluids but you should avoid alcoholic beverages for 24 hours.  ACTIVITY:  You should plan to take it easy for the rest of today and you should NOT DRIVE or use heavy machinery until tomorrow (because of  the sedation medicines used during the test).    FOLLOW UP: Our staff will call the number listed on your records 48-72 hours following your procedure to check on you and address any questions or concerns that you may have regarding the information given to you following your procedure. If we do not reach you, we will leave a message.  We will attempt to reach you two times.  During this call, we will ask if you have developed any symptoms of COVID 19. If you develop any symptoms (ie: fever, flu-like symptoms, shortness of breath, cough etc.) before then, please call [phone].  If you test positive for Covid 19 in the 2 weeks post procedure, please call and report this information to [place].    If any biopsies were taken you will be contacted by phone or by letter within the next 1-3 weeks.  Please call us at [phone] if you have not heard about the biopsies in 3 weeks.    SIGNATURES/CONFIDENTIALITY: You and/or your care partner have signed paperwork which will be entered into your electronic medical record.  These signatures attest to the fact that that the information above on your After Visit Summary has been reviewed and is understood.  Full responsibility of the confidentiality of this discharge information lies with you and/or your care-partner.

## 2019-07-23 NOTE — Progress Notes (Signed)
A/ox3, pleased with MAC, report to RN 

## 2019-07-23 NOTE — Op Note (Signed)
[facility] Patient Name: Laura Castro Procedure Date: 07/23/2019 10:18 AM MRN: [id number] Endoscopist: Viviann Spare P. Adela Lank , MD Age: 68 Referring MD:  Date of Birth: [birth year] Gender: Female Account #: [id number] Procedure:                Colonoscopy Indications:              High risk colon cancer surveillance: Personal                            history of colonic polyps (2 polyps removed 2015) Medicines:                Monitored Anesthesia Care Procedure:                Pre-Anesthesia Assessment:                           - Prior to the procedure, a History and Physical                            was performed, and patient medications and                            allergies were reviewed. The patient's tolerance of                            previous anesthesia was also reviewed. The risks                            and benefits of the procedure and the sedation                            options and risks were discussed with the patient.                            All questions were answered, and informed consent                            was obtained. Prior Anticoagulants: The patient has                            taken no previous anticoagulant or antiplatelet                            agents. ASA Grade Assessment: III - A patient with                            severe systemic disease. After reviewing the risks                            and benefits, the patient was deemed in                            satisfactory condition to undergo the procedure.  After obtaining informed consent, the colonoscope                            was passed under direct vision. Throughout the                            procedure, the patient's blood pressure, pulse, and                            oxygen saturations were monitored continuously. The                            Colonoscope was introduced through the anus and   advanced to the the cecum, identified by                            appendiceal orifice and ileocecal valve. The                            colonoscopy was performed without difficulty. The                            patient tolerated the procedure well. The quality                            of the bowel preparation was adequate. The                            ileocecal valve, appendiceal orifice, and rectum                            were photographed. Scope In: 10:19:34 AM Scope Out: 10:45:28 AM Scope Withdrawal Time: 0 hours 19 minutes 35 seconds  Total Procedure Duration: 0 hours 25 minutes 54 seconds  Findings:                 The perianal and digital rectal examinations were                            normal.                           Multiple small-mouthed diverticula were found in                            the left colon.                           I suspected hypertrophied anal papillae was noted.                            Biopsies were taken with a cold forceps for                            histology to rule out AIN.  There were internal hemorrhoids noted. The exam was                            otherwise without abnormality. Complications:            No immediate complications. Estimated blood loss:                            Minimal. Estimated Blood Loss:     Estimated blood loss was minimal. Impression:               - Diverticulosis in the left colon.                           - Anal papilla(e) were hypertrophied. Biopsied to                            rule out AIN                           - Internal hemorrhoids.                           - The examination was otherwise normal. Recommendation:           - Patient has a contact number available for                            emergencies. The signs and symptoms of potential                            delayed complications were discussed with the                            patient. Return to  normal activities tomorrow.                            Written discharge instructions were provided to the                            patient.                           - Resume previous diet.                           - Continue present medications.                           - Await pathology results. Remo Lipps P. Fields Oros, MD 07/23/2019 10:50:26 AM This report has been signed electronically.

## 2019-07-28 ENCOUNTER — Telehealth: Payer: Self-pay

## 2019-07-28 NOTE — Telephone Encounter (Signed)
  Follow up Call-  Call back number 07/23/2019  Post procedure Call Back phone  # [phone]  Permission to leave phone message Yes  Some recent data might be hidden     Patient questions:  Do you have a fever, pain , or abdominal swelling? No. Pain Score  0 *  Have you tolerated food without any problems? Yes.    Have you been able to return to your normal activities? Yes.    Do you have any questions about your discharge instructions: Diet   No. Medications  No. Follow up visit  No.  Do you have questions or concerns about your Care? No.  Actions: * If pain score is 4 or above: No action needed, pain <4.  1. Have you developed a fever since your procedure? No.  2.   Have you had an respiratory symptoms (SOB or cough) since your procedure? No.  3.   Have you tested positive for COVID 19 since your procedure no.  4.   Have you had any family members/close contacts diagnosed with the COVID 19 since your procedure?  No.   If yes to any of these questions please route to Joylene John, RN and Alphonsa Gin, RN.

## 2019-07-28 NOTE — Telephone Encounter (Signed)
Called [phone] and I was unable to leave a message D/T the mailbox was full that we tried to reach pt for a follow up call. maw

## 2019-07-30 ENCOUNTER — Encounter: Payer: Self-pay | Admitting: Gastroenterology

## 2021-11-27 ENCOUNTER — Emergency Department (HOSPITAL_COMMUNITY): Payer: No Typology Code available for payment source

## 2021-11-27 ENCOUNTER — Encounter (HOSPITAL_COMMUNITY): Payer: Self-pay

## 2021-11-27 ENCOUNTER — Other Ambulatory Visit: Payer: Self-pay

## 2021-11-27 ENCOUNTER — Emergency Department (HOSPITAL_COMMUNITY)
Admission: EM | Admit: 2021-11-27 | Discharge: 2021-11-28 | Disposition: A | Discharge status: Home / Self Care | Payer: No Typology Code available for payment source | Attending: Emergency Medicine | Admitting: Emergency Medicine

## 2021-11-27 DIAGNOSIS — M545 Low back pain, unspecified: Secondary | ICD-10-CM | POA: Diagnosis not present

## 2021-11-27 DIAGNOSIS — I10 Essential (primary) hypertension: Secondary | ICD-10-CM | POA: Insufficient documentation

## 2021-11-27 DIAGNOSIS — K5900 Constipation, unspecified: Secondary | ICD-10-CM | POA: Insufficient documentation

## 2021-11-27 DIAGNOSIS — K529 Noninfective gastroenteritis and colitis, unspecified: Secondary | ICD-10-CM | POA: Insufficient documentation

## 2021-11-27 DIAGNOSIS — J45909 Unspecified asthma, uncomplicated: Secondary | ICD-10-CM | POA: Insufficient documentation

## 2021-11-27 LAB — URINALYSIS, ROUTINE W REFLEX MICROSCOPIC
Bilirubin Urine: NEGATIVE
Glucose, UA: NEGATIVE mg/dL
Hgb urine dipstick: NEGATIVE
Ketones, ur: NEGATIVE mg/dL
Leukocytes,Ua: NEGATIVE
Nitrite: NEGATIVE
Protein, ur: 30 mg/dL — AB
Specific Gravity, Urine: 1.011 (ref 1.005–1.030)
pH: 6 (ref 5.0–8.0)

## 2021-11-27 LAB — CBC WITH DIFFERENTIAL/PLATELET
Abs Immature Granulocytes: 0.04 10*3/uL (ref 0.00–0.07)
Basophils Absolute: 0 10*3/uL (ref 0.0–0.1)
Basophils Relative: 0 %
Eosinophils Absolute: 0.2 10*3/uL (ref 0.0–0.5)
Eosinophils Relative: 2 %
HCT: 38.4 % (ref 36.0–46.0)
Hemoglobin: 12.5 g/dL (ref 12.0–15.0)
Immature Granulocytes: 0 %
Lymphocytes Relative: 25 %
Lymphs Abs: 3.1 10*3/uL (ref 0.7–4.0)
MCH: 28.7 pg (ref 26.0–34.0)
MCHC: 32.6 g/dL (ref 30.0–36.0)
MCV: 88.1 fL (ref 80.0–100.0)
Monocytes Absolute: 0.8 10*3/uL (ref 0.1–1.0)
Monocytes Relative: 6 %
Neutro Abs: 8.3 10*3/uL — ABNORMAL HIGH (ref 1.7–7.7)
Neutrophils Relative %: 67 %
Platelets: 332 10*3/uL (ref 150–400)
RBC: 4.36 MIL/uL (ref 3.87–5.11)
RDW: 16 % — ABNORMAL HIGH (ref 11.5–15.5)
WBC: 12.4 10*3/uL — ABNORMAL HIGH (ref 4.0–10.5)
nRBC: 0 % (ref 0.0–0.2)

## 2021-11-27 LAB — COMPREHENSIVE METABOLIC PANEL
ALT: 24 U/L (ref 0–44)
AST: 31 U/L (ref 15–41)
Albumin: 3.6 g/dL (ref 3.5–5.0)
Alkaline Phosphatase: 98 U/L (ref 38–126)
Anion gap: 9 (ref 5–15)
BUN: 10 mg/dL (ref 8–23)
CO2: 27 mmol/L (ref 22–32)
Calcium: 8.5 mg/dL — ABNORMAL LOW (ref 8.9–10.3)
Chloride: 99 mmol/L (ref 98–111)
Creatinine, Ser: 0.8 mg/dL (ref 0.44–1.00)
GFR, Estimated: >60 mL/min (ref >60)
Glucose, Bld: 107 mg/dL — ABNORMAL HIGH (ref 70–99)
Potassium: 3.7 mmol/L (ref 3.5–5.1)
Sodium: 135 mmol/L (ref 135–145)
Total Bilirubin: 1.3 mg/dL — ABNORMAL HIGH (ref 0.3–1.2)
Total Protein: 7.4 g/dL (ref 6.5–8.1)

## 2021-11-27 LAB — LIPASE, BLOOD: Lipase: 29 U/L (ref 11–51)

## 2021-11-27 MED ORDER — ACETAMINOPHEN 500 MG PO TABS
1000.0000 mg | ORAL_TABLET | Freq: Once | ORAL | Status: AC
Start: 2021-11-27 — End: 2021-11-27
  Administered 2021-11-27: 1000 mg via ORAL
  Filled 2021-11-27: qty 2

## 2021-11-27 MED ORDER — LIDOCAINE 5 % EX PTCH
1.0000 | MEDICATED_PATCH | Freq: Once | CUTANEOUS | Status: DC
  Administered 2021-11-27: 1 via TRANSDERMAL
  Filled 2021-11-27: qty 1

## 2021-11-27 MED ORDER — SODIUM CHLORIDE 0.9 % IV BOLUS
500.0000 mL | Freq: Once | INTRAVENOUS | Status: AC
  Administered 2021-11-27: 500 mL via INTRAVENOUS

## 2021-11-27 MED ORDER — MORPHINE SULFATE (PF) 4 MG/ML IV SOLN
4.0000 mg | Freq: Once | INTRAVENOUS | Status: AC
  Administered 2021-11-27: 4 mg via INTRAVENOUS
  Filled 2021-11-27: qty 1

## 2021-11-27 MED ORDER — IOHEXOL 350 MG/ML SOLN
80.0000 mL | Freq: Once | INTRAVENOUS | Status: AC | PRN
  Administered 2021-11-27: 80 mL via INTRAVENOUS

## 2021-11-27 NOTE — ED Triage Notes (Signed)
Per EMS- Patient c/o bilateral flank pain and lower back pain since yesterday. Patient denies dysuria or a history of kidney stones. Patient did state she she had a fall 1 month ago.

## 2021-11-27 NOTE — ED Provider Notes (Signed)
[place] DEPT Provider Note   CSN: [id number] Arrival date & time: 11/27/21  1711     History  Chief Complaint  Patient presents with   Back Pain    Laura Castro is a 71 y.o. female.  Laura Castro is a 71 y.o. female with history of hypertension, asthma, GERD, fibromyalgia, anxiety and depression, who presents to the emergency department for evaluation of low back pain.  Patient reports pain has been present since yesterday.  She reports pain is present across her low back.  She reports that with standing she also feels like pain comes around into her lower abdomen and she reports that her lower abdomen feels heavy.  She reports that she fell, she thinks about a week ago, but is unsure exactly when she fell, at that time she fell onto her right side and was not experiencing this back pain immediately after the fall.  She denies any radiation of pain into her legs, no numbness or weakness.  No loss of bowel or bladder control or saddle anesthesia.  Patient reports that she does have chronic issues with constipation, typically takes Colace and Dulcolax daily but ran out of these medications 2 weeks ago and just got new supply.  No associated nausea or vomiting.  No dysuria, urinary frequency or hematuria.  No prior history of kidney stones.  Aggravating or alleviating factors.  The history is provided by the patient and medical records.       Home Medications Prior to Admission medications   Medication Sig Start Date End Date Taking? Authorizing Provider  methocarbamol (ROBAXIN) 500 MG tablet Take 1 tablet (500 mg total) by mouth 2 (two) times daily. 11/28/21  Yes Jacqlyn Larsen, PA-C  methylPREDNISolone (MEDROL DOSEPAK) 4 MG TBPK tablet Take as directed 11/28/21  Yes Jacqlyn Larsen, PA-C  albuterol (PROVENTIL HFA;VENTOLIN HFA) 108 (90 BASE) MCG/ACT inhaler Inhale 2 puffs into the lungs every 6 (six) hours as needed for wheezing.    [provider]  Ascorbic Acid (VITAMIN C PO) Take by mouth.    [provider]  aspirin 81 MG chewable tablet Chew 81 mg by mouth daily.      [provider]  bisacodyl (DULCOLAX) 5 MG EC tablet Take 1 tablet (5 mg total) by mouth once for 1 dose. 07/12/19 07/12/19  Yetta Flock, MD  busPIRone (BUSPAR) 15 MG tablet Take 15 mg by mouth 4 (four) times daily.    [provider]  chlorpheniramine-HYDROcodone (TUSSIONEX PENNKINETIC ER) 10-8 MG/5ML SUER Take 5 mLs by mouth at bedtime as needed for cough. Patient not taking: Reported on 07/09/2019 02/06/16   Ripley Fraise, MD  Cholecalciferol (VITAMIN D3 SUPER STRENGTH) 50 MCG (2000 UT) TABS Take by mouth.    [provider]  docusate sodium (COLACE) 100 MG capsule Take 200 mg by mouth 2 (two) times daily.    [provider]  hydrochlorothiazide (HYDRODIURIL) 25 MG tablet Take 25 mg by mouth daily.      [provider]  LORazepam (ATIVAN) 2 MG tablet Take 2 mg by mouth every 8 (eight) hours as needed for anxiety.     [provider]  Multiple Vitamin (MULTI-VITAMIN PO) Take by mouth.    [provider]  Omega-3 1000 MG CAPS Take by mouth.    [provider]  OXcarbazepine (TRILEPTAL) 150 MG tablet Take by mouth.    [provider]  perphenazine (TRILAFON) 16 MG tablet Take  16 mg by mouth 2 (two) times daily.     [provider]  pregabalin (LYRICA) 75 MG capsule Take by mouth.    [provider]  senna (SENOKOT) 8.6 MG tablet Take by mouth.    [provider]  venlafaxine (EFFEXOR) 75 MG tablet Take 225 mg by mouth daily.    [provider]  zolpidem (AMBIEN) 10 MG tablet Take by mouth.    [provider]      Allergies    Sulfa antibiotics and Elemental sulfur    Review of Systems   Review of Systems  HENT: Negative.    Respiratory:  Negative for cough.   All other systems reviewed and are  negative.  Physical Exam Updated Vital Signs BP (!) 136/100    Pulse 90    Temp 98.2 F (36.8 C) (Oral)    Resp 19    Ht 5' 3.25" (1.607 m)    Wt 124.7 kg    SpO2 99%    BMI 48.33 kg/m  Physical Exam Vitals and nursing note reviewed.  Constitutional:      General: She is not in acute distress.    Appearance: Normal appearance. She is well-developed. She is obese. She is not ill-appearing or diaphoretic.  HENT:     Head: Normocephalic and atraumatic.  Eyes:     General:        Right eye: No discharge.        Left eye: No discharge.  Cardiovascular:     Rate and Rhythm: Normal rate and regular rhythm.     Pulses: Normal pulses.     Heart sounds: Normal heart sounds. No murmur heard.   No friction rub. No gallop.  Pulmonary:     Effort: Pulmonary effort is normal. No respiratory distress.     Breath sounds: Normal breath sounds. No wheezing or rales.     Comments: Respirations equal and unlabored, patient able to speak in full sentences, lungs clear to auscultation bilaterally  Abdominal:     General: Bowel sounds are normal. There is no distension.     Palpations: Abdomen is soft. There is no mass.     Tenderness: There is no abdominal tenderness. There is no guarding.     Comments: Abdomen soft, mild distended, bowel sounds present, nontender to palpation currently, no CVA tenderness.  Musculoskeletal:        General: Tenderness present. No deformity.     Cervical back: Neck supple.     Comments: Tenderness across the low back, no overlying skin changes, no midline step-off or deformity.  Pain worse with movement.  Negative straight leg raise bilaterally.  Skin:    General: Skin is warm and dry.     Capillary Refill: Capillary refill takes less than 2 seconds.  Neurological:     Mental Status: She is alert and oriented to person, place, and time.     Coordination: Coordination normal.     Comments: Speech is clear, able to follow commands CN III-XII intact Normal strength  in upper and lower extremities bilaterally including dorsiflexion and plantar flexion, strong and equal grip strength Sensation normal to light and sharp touch Moves extremities without ataxia, coordination intact  Psychiatric:        Mood and Affect: Mood normal.        Behavior: Behavior normal.    ED Results / Procedures / Treatments   Labs (all labs ordered are listed, but only abnormal results are displayed) Labs  Reviewed  URINALYSIS, ROUTINE W REFLEX MICROSCOPIC - Abnormal; Notable for the following components:      Result Value   Protein, ur 30 (*)    Bacteria, UA RARE (*)    All other components within normal limits  COMPREHENSIVE METABOLIC PANEL - Abnormal; Notable for the following components:   Glucose, Bld 107 (*)    Calcium 8.5 (*)    Total Bilirubin 1.3 (*)    All other components within normal limits  CBC WITH DIFFERENTIAL/PLATELET - Abnormal; Notable for the following components:   WBC 12.4 (*)    RDW 16.0 (*)    Neutro Abs 8.3 (*)    All other components within normal limits  LIPASE, BLOOD    EKG None  Radiology DG Lumbar Spine Complete  Result Date: 11/27/2021 CLINICAL DATA:  back pain EXAM: LUMBAR SPINE - COMPLETE 4+ VIEW COMPARISON:  None. FINDINGS: Five non-rib-bearing lumbar vertebral bodies. There is no evidence of lumbar spine fracture. Grade 1 anterolisthesis of L4 on L5. Facet arthropathy and osteophyte formation of the lumbar spine. Intervertebral disc space narrowing at the L4-L5 L5-S1 level. Thoracic degenerative changes noted. Atherosclerotic plaque. Tubal ligation clips overlie the pelvis. Right upper quadrant surgical clips noted. IMPRESSION: 1. No acute displaced fracture or traumatic listhesis of the lumbar spine in a patient with grade 1 anterolisthesis of L4 on L5. 2.  Aortic Atherosclerosis (ICD10-I70.0). Electronically Signed   By: Iven Finn M.D.   On: 11/27/2021 18:23   DG Sacrum/Coccyx  Result Date: 11/27/2021 CLINICAL DATA:  Back  pain EXAM: SACRUM AND COCCYX - 2+ VIEW COMPARISON:  None. FINDINGS: Ligation clips in the pelvis. The SI joints are non widened. No acute fracture is seen. IMPRESSION: Negative. Electronically Signed   By: Donavan Foil M.D.   On: 11/27/2021 18:24   CT Abdomen Pelvis W Contrast  Result Date: 11/28/2021 CLINICAL DATA:  Acute abdomen pain without localization. Bilateral flank pain and lower back pain. Fell 1 month ago. EXAM: CT ABDOMEN AND PELVIS WITH CONTRAST TECHNIQUE: Multidetector CT imaging of the abdomen and pelvis was performed using the standard protocol following bolus administration of intravenous contrast. CONTRAST:  72mL OMNIPAQUE IOHEXOL 350 MG/ML SOLN COMPARISON:  Similar study dated 10/17/2010 FINDINGS: Lower chest: There is increased subpleural haziness and reticulation in the posterior right lower lobe which could be asymmetric interval new chronic change or subpleural pneumonitis. The lung bases are otherwise clear, with mildly elevated right diaphragm. The cardiac size is normal. There is no pericardial effusion. Hepatobiliary: 17.3 cm length slightly steatotic liver without mass or ductal dilatation. Old cholecystectomy. Pancreas: Partially atrophic and otherwise unremarkable with no mass, ductal dilatation or adjacent fluid or edema. Spleen: Normal size and enhancement. Adrenals/Urinary Tract: No adrenal or renal cortical mass enhancement. No stones or hydronephrosis are seen, with unremarkable bladder. Numerous bilateral pelvic phleboliths are unchanged. Stomach/Bowel: There is mild fold thickening in the stomach, duodenum and jejunum. No bowel obstruction, inflammatory changes or evidence of appendicitis with interval appendectomy noted. Moderate stool retention ascending colon. No findings of acute colitis or diverticulitis Vascular/Lymphatic: Aortic atherosclerosis. No enlarged abdominal or pelvic lymph nodes. Reproductive: The uterus is intact. BTL clips are again seen bilaterally. No  adnexal masses seen. Other: There is no free air, hemorrhage or fluid. Small umbilical fat hernia. Right rectus atrophy chronically above the level of the umbilicus. Musculoskeletal: Increased facet degenerative hypertrophic change and grade 1 anterolisthesis at L4-5 with increasing acquired spinal canal and foraminal stenosis. Additional degenerative facet hypertrophy in  the remainder of the lumbar region. No regional skeletal fracture is seen. IMPRESSION: 1. No acute findings in the abdomen or pelvis. 2. Mild gastroenteritis without obstructive changes or mesenteric inflammation. 3. Constipation. 4. Interval appendectomy. 5. Aortic atherosclerosis. 6. Degenerative changes lumbar spine, increased at L4-5 as well as grade 1 anterolisthesis and acquired spinal canal and foraminal stenosis. 7. No evidence of urinary stones or obstruction. No bladder thickening. 8. Subpleural reticulation adhesion is increased in the right lower lobe which could be interval new chronic change or peripheral pneumonitis. Follow-up as indicated. Electronically Signed   By: Almira Bar M.D.   On: 11/28/2021 00:03    Procedures Procedures    Medications Ordered in ED Medications  lidocaine (LIDODERM) 5 % 1 patch (1 patch Transdermal Patch Applied 11/27/21 2304)  acetaminophen (TYLENOL) tablet 1,000 mg (1,000 mg Oral Given 11/27/21 2302)  morphine 4 MG/ML injection 4 mg (4 mg Intravenous Given 11/27/21 2329)  sodium chloride 0.9 % bolus 500 mL (0 mLs Intravenous Stopped 11/28/21 0038)  iohexol (OMNIPAQUE) 350 MG/ML injection 80 mL (80 mLs Intravenous Contrast Given 11/27/21 2333)    ED Course/ Medical Decision Making/ A&P                           Medical Decision Making  71 y.o. female presents to the ED with complaints of low back pain, abdominal discomfort and bloating, this involves an extensive number of treatment options, and is a complaint that carries with it a high risk of complications and morbidity.  The  differential diagnosis includes musculoskeletal back pain, constipation, diverticulitis, colitis, appendicitis, gastroenteritis, bowel obstruction, compression fracture bilateral  On arrival pt is nontoxic, vitals significant for hypertension but otherwise unremarkable. Exam significant for mild generalized abdominal distention, bowel sounds present, no focal abdominal tenderness although patient reports worsened abdominal pain when standing, patient also has diffuse tenderness across the low back, no overlying skin changes, no focal neurologic deficits  Additional history obtained from chart review.  Outside records obtained and reviewed via EMR  I ordered Tylenol, morphine, and Lidoderm patch for symptomatic management  Lab Tests:  I Ordered, reviewed, and interpreted labs, which included: Mild leukocytosis of 12.4, normal hemoglobin, no significant electrolyte derangements, normal renal and liver function, normal lipase and no evidence of UTI on UA  Imaging Studies ordered:  X-rays of the lumbar spine and coccyx ordered from triage, additionally I ordered CT abdomen pelvis, I independently visualized and interpreted imaging which showed no evidence of acute fracture on plain films.  CT with some degree of constipation but no acute findings within the abdomen or pelvis.  There are degenerative changes of the lumbar spine increased at L4-L5 with grade 1 anterolisthesis and acquired spinal canal and foraminal stenosis  ED Course:   Overall evaluation today is reassuring.  Suspect pain is most likely due to worsening degenerative changes of the lumbar spine, and constipation may be contributing to mild distention and discomfort in the abdomen with standing.  Patient ran out of her constipation medications for the past 2 weeks but got new shipments this week and has restarted them.  Despite noted canal and foraminal stenosis on CT patient is neurologically intact with no loss of bowel or bladder  control or saddle anesthesia.  After treatment of her pain here in the ED patient is able to ambulate with her cane which is her baseline.  I would like to avoid narcotic pain medication as patient already  struggles with constipation, will have patient treat with Tylenol, Robaxin as needed for spasm and Medrol Dosepak.  We will have patient follow-up closely with PCP and encourage follow-up with neurosurgery or orthopedics.  Provided neurosurgery follow-up through our system but patient receives most of her care through the [place].  Recommended that she call her PCP for appropriate referral.  Discussed return precautions.  At this time feel patient is appropriate for discharge and continued supportive care as an outpatient.    Portions of this note were generated with [employer]. Dictation errors may occur despite best attempts at proofreading.         Final Clinical Impression(s) / ED Diagnoses Final diagnoses:  Acute bilateral low back pain without sciatica  Constipation, unspecified constipation type    Rx / DC Orders ED Discharge Orders          Ordered    methocarbamol (ROBAXIN) 500 MG tablet  2 times daily        11/28/21 0132    methylPREDNISolone (MEDROL DOSEPAK) 4 MG TBPK tablet        11/28/21 0132              Jacqlyn Larsen, PA-C 11/28/21 [id number]    Drenda Freeze, MD 11/28/21 2308

## 2021-11-27 NOTE — ED Provider Triage Note (Signed)
Emergency Medicine Provider Triage Evaluation Note  Laura Castro , a 71 y.o. female  was evaluated in triage.  Pt complains of back pain. Fal 3 days ago. Worse with movement, pain in sacral region . No urinary sxs  Review of Systems  Positive: Back pain Negative: Urinary sxs  Physical Exam  SpO2 99%  Gen:   Awake, no distress   Resp:  Normal effort  MSK:   Moves extremities without difficulty Other:  Pain with change of position- cannot reproduce pain on palpation  Medical Decision Making  Medically screening exam initiated at 5:24 PM.  Appropriate orders placed.  KYARAH ENAMORADO was informed that the remainder of the evaluation will be completed by another provider, this initial triage assessment does not replace that evaluation, and the importance of remaining in the ED until their evaluation is complete.  Work up [employer], [place], PA-C 11/27/21 1725

## 2021-11-28 MED ORDER — METHYLPREDNISOLONE 4 MG PO TBPK
ORAL_TABLET | ORAL | 0 refills | Status: DC
Start: 2021-11-28 — End: 2022-07-09

## 2021-11-28 MED ORDER — METHOCARBAMOL 500 MG PO TABS: 500.0000 mg | ORAL_TABLET | Freq: Two times a day (BID) | ORAL | 0 refills | Status: DC

## 2021-11-28 NOTE — ED Notes (Signed)
Ambulates slowly with quad cane.

## 2021-11-28 NOTE — Discharge Instructions (Addendum)
Your CT scan today shows degenerative changes in your lumbar spine that may be contributing to your back pain at L4-L5.  It also shows some constipation.  The rest of your work-up has been reassuring.  Please make sure you are continuing to take your call Colace and Dulcolax for constipation regularly.  You were seen here today for Back Pain: Low back pain is discomfort in the lower back that may be due to injuries to muscles and ligaments around the spine. Occasionally, it may be caused by a problem to a part of the spine called a disc. Your back pain should be treated with medicines listed below as well as back exercises and this back pain should get better over the next 2 weeks. If you develop severe or worsening pain, low back pain with fever, numbness, weakness or inability to walk or urinate, you should return to the ER immediately.  Please follow up with your doctor this week for a recheck if still having symptoms.  HOME INSTRUCTIONS Self - care:  The application of heat can help soothe the pain.  Maintaining your daily activities, including walking (this is encouraged), as it will help you get better faster than just staying in bed. Do not life, push, pull anything more than 10 pounds for the next week. I am attaching back exercises that you can do at home to help facilitate your recovery.   Back Exercises - I have attached a handout on back exercises that can be done at home to help facilitate your recovery.   Medications are also useful to help with pain control.   Acetaminophen.  This medication is generally safe, and found over the counter. Take as directed for your age. You should not take more than 8 of the extra strength (500mg ) pills a day (max dose is 4000mg  total OVER one day)  Lidocaine Patch: Salon Pas lidocaine patches (blue and silver box) can be purchased over the counter and worn for 12 hours for local pain relief   Muscle relaxants:  These medications can help with muscle  tightness that is a cause of lower back pain.  Most of these medications can cause drowsiness, and it is not safe to drive or use dangerous machinery while taking them. They are primarily helpful when taken at night before sleep.  Medrol -  This is an oral steroid.  This medication is best taken with food in the morning.  Please note that this medication can cause anxiety, mood swings, muscle fatigue, increased hunger, weight gain (sodium/fluid retention), poor sleep as well as other symptoms. If you are a diabetic, please monitor your blood sugars at home as this medication can increase your blood sugars. Call your pharmacist if you have any questions.  You will need to follow up with your primary healthcare provider or the neurosurgery in 1-2 weeks for reassessment and persistent symptoms.  Be aware that if you develop new symptoms, such as a fever, leg weakness, difficulty with or loss of control of your urine or bowels, abdominal pain, or more severe pain, you will need to seek medical attention and/or return to the Emergency department. Additional Information:  Your vital signs today were: BP (!) 142/85    Pulse 87    Temp 98.7 F (37.1 C) (Oral)    Resp 17    Ht 5' 3.25" (1.607 m)    Wt 124.7 kg    SpO2 97%    BMI 48.33 kg/m  If your blood pressure (BP)  was elevated above 135/85 this visit, please have this repeated by your doctor within one month. ---------------

## 2022-07-09 ENCOUNTER — Encounter (HOSPITAL_COMMUNITY): Payer: Self-pay

## 2022-07-09 ENCOUNTER — Ambulatory Visit (HOSPITAL_COMMUNITY)
Admission: EM | Admit: 2022-07-09 | Discharge: 2022-07-09 | Disposition: A | Discharge status: Home / Self Care | Payer: No Typology Code available for payment source | Attending: Internal Medicine | Admitting: Internal Medicine

## 2022-07-09 DIAGNOSIS — R300 Dysuria: Secondary | ICD-10-CM

## 2022-07-09 DIAGNOSIS — R350 Frequency of micturition: Secondary | ICD-10-CM

## 2022-07-09 DIAGNOSIS — N3001 Acute cystitis with hematuria: Secondary | ICD-10-CM | POA: Diagnosis not present

## 2022-07-09 LAB — POCT URINALYSIS DIPSTICK, ED / UC
Glucose, UA: NEGATIVE mg/dL
Nitrite: NEGATIVE
Protein, ur: >=300 mg/dL — AB
Specific Gravity, Urine: 1.025 (ref 1.005–1.030)
Urobilinogen, UA: 2 mg/dL — ABNORMAL HIGH (ref 0.0–1.0)
pH: 6 (ref 5.0–8.0)

## 2022-07-09 MED ORDER — PHENAZOPYRIDINE HCL 200 MG PO TABS: 200.0000 mg | ORAL_TABLET | Freq: Three times a day (TID) | ORAL | 0 refills | Status: DC

## 2022-07-09 MED ORDER — CEPHALEXIN 500 MG PO CAPS: 500.0000 mg | ORAL_CAPSULE | Freq: Three times a day (TID) | ORAL | 0 refills | Status: DC

## 2022-07-09 NOTE — ED Provider Notes (Signed)
[facility]    CSN: [id number] Arrival date & time: 07/09/22  1841      History   Chief Complaint Chief Complaint  Patient presents with   Dysuria    HPI Laura Castro is a 71 y.o. female.   Patient presents today with a 2-day history of urinary symptoms.  Reports dysuria, frequency, urgency, hematuria.  Denies any fever, abdominal pain, nausea, vomiting, pelvic pain, vaginal discharge.  She does have a history of UTIs with similar presentation.  She has not seen a urologist for UTI but does follow with someone for incontinence.  She denies any recent antibiotic use.  She has not tried any over-the-counter medication for symptom management.  Denies history of nephrolithiasis, single kidney, self-catheterization, recent urogenital procedure.  Denies history of diabetes and she does not take SGLT2 inhibitor.    Past Medical History:  Diagnosis Date   Anxiety    Arthritis    Asthma    Cataract    Depression    GERD (gastroesophageal reflux disease)    Hepatitis    Hypertension    Neuromuscular disorder ([place])    Fibromyalgia   PID (acute pelvic inflammatory disease)    Sleep apnea    no cpap    Staph infection    in late 70's -early 80's   Substance abuse ([place])     There are no problems to display for this patient.   Past Surgical History:  Procedure Laterality Date   APPENDECTOMY     CESAREAN SECTION     CHOLECYSTECTOMY     COLONOSCOPY     CYST REMOVAL NECK     cyst removed from vagina     POLYPECTOMY     staph infection surgery Right    arm     OB History   No obstetric history on file.      Home Medications    Prior to Admission medications   Medication Sig Start Date End Date Taking? Authorizing Provider  cephALEXin (KEFLEX) 500 MG capsule Take 1 capsule (500 mg total) by mouth 3 (three) times daily. 07/09/22  Yes Haniel Fix K, PA-C  albuterol (PROVENTIL HFA;VENTOLIN HFA) 108 (90 BASE) MCG/ACT inhaler Inhale 2 puffs into the lungs  every 6 (six) hours as needed for wheezing.    [provider]  Ascorbic Acid (VITAMIN C PO) Take by mouth.    [provider]  aspirin 81 MG chewable tablet Chew 81 mg by mouth daily.      [provider]  bisacodyl (DULCOLAX) 5 MG EC tablet Take 1 tablet (5 mg total) by mouth once for 1 dose. 07/12/19 07/12/19  Yetta Flock, MD  busPIRone (BUSPAR) 15 MG tablet Take 15 mg by mouth 4 (four) times daily.    [provider]  chlorpheniramine-HYDROcodone (TUSSIONEX PENNKINETIC ER) 10-8 MG/5ML SUER Take 5 mLs by mouth at bedtime as needed for cough. Patient not taking: Reported on 07/09/2019 02/06/16   Ripley Fraise, MD  Cholecalciferol (VITAMIN D3 SUPER STRENGTH) 50 MCG (2000 UT) TABS Take by mouth.    [provider]  docusate sodium (COLACE) 100 MG capsule Take 200 mg by mouth 2 (two) times daily.    [provider]  hydrochlorothiazide (HYDRODIURIL) 25 MG tablet Take 25 mg by mouth daily.      [provider]  LORazepam (ATIVAN) 2 MG tablet Take 2 mg by mouth every 8 (eight) hours as needed for anxiety.     [provider]  methocarbamol (ROBAXIN) 500 MG tablet Take 1 tablet (500 mg total) by mouth 2 (two) times daily. 11/28/21   Jacqlyn Larsen, PA-C  methylPREDNISolone (MEDROL DOSEPAK) 4 MG TBPK tablet Take as directed 11/28/21   Jacqlyn Larsen, PA-C  Multiple Vitamin (MULTI-VITAMIN PO) Take by mouth.    [provider]  Omega-3 1000 MG CAPS Take by mouth.    [provider]  OXcarbazepine (TRILEPTAL) 150 MG tablet Take by mouth.    [provider]  perphenazine (TRILAFON) 16 MG tablet Take 16 mg by mouth 2 (two) times daily.     [provider]  pregabalin (LYRICA) 75 MG capsule Take by mouth.    [provider]  senna (SENOKOT) 8.6 MG tablet Take by mouth.    [provider]  venlafaxine (EFFEXOR) 75 MG tablet Take 225 mg by mouth daily.    [provider]   zolpidem (AMBIEN) 10 MG tablet Take by mouth.    [provider]    Family History Family History  Problem Relation Age of Onset   Stroke Mother    Hypertension Mother    Diabetes Other    Colon cancer Neg Hx    Esophageal cancer Neg Hx    Rectal cancer Neg Hx    Stomach cancer Neg Hx     Social History Social History   Tobacco Use   Smoking status: Never   Smokeless tobacco: Never  Substance Use Topics   Alcohol use: Not Currently    Comment: rare/social   Drug use: Yes    Frequency: 2.0 times per week    Types: Marijuana     Allergies   Sulfa antibiotics and Elemental sulfur   Review of Systems Review of Systems  Constitutional:  Positive for activity change. Negative for appetite change, fatigue and fever.  Gastrointestinal:  Negative for abdominal pain, diarrhea, nausea and vomiting.  Genitourinary:  Positive for dysuria, frequency, hematuria and urgency. Negative for vaginal bleeding, vaginal discharge and vaginal pain.  Musculoskeletal:  Negative for arthralgias and myalgias.     Physical Exam Triage Vital Signs ED Triage Vitals  Enc Vitals Group     BP 07/09/22 1934 (!) 147/89     Pulse Rate 07/09/22 1934 78     Resp 07/09/22 1934 12     Temp 07/09/22 1934 98 F (36.7 C)     Temp Source 07/09/22 1934 Oral     SpO2 07/09/22 1934 98 %     Weight 07/09/22 1930 276 lb (125.2 kg)     Height --      Head Circumference --      Peak Flow --      Pain Score 07/09/22 1932 3     Pain Loc --      Pain Edu? --      Excl. in [place]? --    No data found.  Updated Vital Signs BP (!) 147/89 (BP Location: Right Arm)   Pulse 78   Temp 98 F (36.7 C) (Oral)   Resp 12   Wt 276 lb (125.2 kg)   SpO2 98%   BMI 48.51 kg/m   Visual Acuity Right Eye Distance:   Left Eye Distance:   Bilateral Distance:    Right Eye Near:   Left Eye Near:    Bilateral Near:     Physical Exam Vitals reviewed.  Constitutional:      General: She is awake. She is  not in acute distress.  Appearance: Normal appearance. She is well-developed. She is not ill-appearing.     Comments: Very pleasant female appears stated age in no acute distress sitting comfortably in exam room  HENT:     Head: Normocephalic and atraumatic.  Cardiovascular:     Rate and Rhythm: Normal rate and regular rhythm.     Heart sounds: Normal heart sounds, S1 normal and S2 normal. No murmur heard. Pulmonary:     Effort: Pulmonary effort is normal.     Breath sounds: Normal breath sounds. No wheezing, rhonchi or rales.     Comments: Clear to auscultation bilaterally Abdominal:     General: Bowel sounds are normal.     Palpations: Abdomen is soft.     Tenderness: There is no abdominal tenderness. There is no right CVA tenderness, left CVA tenderness, guarding or rebound.     Comments: Benign abdominal exam  Psychiatric:        Behavior: Behavior is cooperative.      UC Treatments / Results  Labs (all labs ordered are listed, but only abnormal results are displayed) Labs Reviewed  POCT URINALYSIS DIPSTICK, ED / UC - Abnormal; Notable for the following components:      Result Value   Bilirubin Urine SMALL (*)    Ketones, ur TRACE (*)    Hgb urine dipstick LARGE (*)    Protein, ur >=300 (*)    Urobilinogen, UA 2.0 (*)    Leukocytes,Ua SMALL (*)    All other components within normal limits    EKG   Radiology No results found.  Procedures Procedures (including critical care time)  Medications Ordered in UC Medications - No data to display  Initial Impression / Assessment and Plan / UC Course  I have reviewed the triage vital signs and the nursing notes.  Pertinent labs & imaging results that were available during my care of the patient were reviewed by me and considered in my medical decision making (see chart for details).     Patient is well-appearing, afebrile, nontoxic, nontachycardic.  UA shows evidence of UTI.  Unfortunately, we do not have the  ability to send off cultures as we ran out of supplies in clinic.  We will treat with Keflex but we discussed that if she does not have significant improvement within a few days she should return for reevaluation at which point we would consider obtaining a culture.  No indication for dose adjustment of Keflex as last metabolic panel from 7/68/1157 showed EGFR greater than 60.  She is to rest and drink plenty of fluid.  If she develops any worsening symptoms including abdominal pain, pelvic pain, fever, nausea, vomiting she needs to go to the emergency room to which she expressed understanding.  Final Clinical Impressions(s) / UC Diagnoses   Final diagnoses:  Acute cystitis with hematuria  Dysuria  Urinary frequency     Discharge Instructions      We are treating you for urinary tract infection.  Take Keflex 3 times a day.  Make sure you rest and drink plenty fluid.  If your symptoms or not improving within a few days please return for reevaluation so we can collect a culture.  If you have any worsening symptoms including fever, abdominal pain, pelvic pain, nausea, vomiting, blood in your urine, back pain, weakness you need to go to the emergency room.     ED Prescriptions     Medication Sig Dispense Auth. Provider   cephALEXin (KEFLEX) 500 MG capsule Take 1  capsule (500 mg total) by mouth 3 (three) times daily. 21 capsule Jari Carollo K, PA-C      PDMP not reviewed this encounter.   Terrilee Croak, PA-C 07/09/22 1955

## 2022-07-09 NOTE — ED Triage Notes (Signed)
Pt is here for possible uti. Pt states she has urgency, burning sensation when urinating. Pt denies fever x2days. Pt also states she blood in her urine

## 2022-07-09 NOTE — Discharge Instructions (Addendum)
We are treating you for urinary tract infection.  Take Keflex 3 times a day.  Make sure you rest and drink plenty fluid.  If your symptoms or not improving within a few days please return for reevaluation so we can collect a culture.  If you have any worsening symptoms including fever, abdominal pain, pelvic pain, nausea, vomiting, blood in your urine, back pain, weakness you need to go to the emergency room.

## 2022-08-06 ENCOUNTER — Ambulatory Visit (HOSPITAL_COMMUNITY)
Admission: EM | Admit: 2022-08-06 | Discharge: 2022-08-06 | Disposition: A | Discharge status: Home / Self Care | Payer: No Typology Code available for payment source | Attending: Emergency Medicine | Admitting: Emergency Medicine

## 2022-08-06 ENCOUNTER — Encounter (HOSPITAL_COMMUNITY): Payer: Self-pay | Admitting: *Deleted

## 2022-08-06 DIAGNOSIS — Z599 Problem related to housing and economic circumstances, unspecified: Secondary | ICD-10-CM

## 2022-08-06 DIAGNOSIS — Z658 Other specified problems related to psychosocial circumstances: Secondary | ICD-10-CM

## 2022-08-06 DIAGNOSIS — M7989 Other specified soft tissue disorders: Secondary | ICD-10-CM | POA: Diagnosis not present

## 2022-08-06 DIAGNOSIS — Z789 Other specified health status: Secondary | ICD-10-CM

## 2022-08-06 MED ORDER — ACETAMINOPHEN 325 MG PO TABS
ORAL_TABLET | ORAL | Status: AC
  Filled 2022-08-06: qty 2

## 2022-08-06 MED ORDER — ACETAMINOPHEN 325 MG PO TABS
650.0000 mg | ORAL_TABLET | Freq: Once | ORAL | Status: AC
  Administered 2022-08-06: 650 mg via ORAL

## 2022-08-06 MED ORDER — ACETAMINOPHEN 325 MG PO TABS: 650.0000 mg | ORAL_TABLET | Freq: Two times a day (BID) | ORAL | 2 refills | Status: AC | PRN

## 2022-08-06 NOTE — ED Triage Notes (Signed)
Pt states bilateral feet swelling x 3 days now they are hurting. She has been taking a lot of ASA for the pain but not helping.

## 2022-08-06 NOTE — ED Provider Notes (Signed)
Laura Castro    CSN: [id number] Arrival date & time: 08/06/22  [place]      History   Chief Complaint Chief Complaint  Patient presents with   Foot Pain    HPI Laura Castro is a 71 y.o. female.  Presents with bilateral foot swelling for 3 days. Reports pain in both feet, hard to walk Has been taking 6 or 7 aspirin daily for pain without relief  No injury or trauma. No fever.   Reports history of foot swelling.  Denies any history of heart problems  She is a veteran Reports the [place] does not do much to help her  She has many barriers to care including finances, social support, transportation, housing Reports she does not have a bed she can lay down in and cannot elevate her legs Has been unable to see her primary care provider recently due to lack of transportation Her grandson brought her today, but she does not want to elaborate on him  She has an appointment with orthopedics next week, but unknown transportation  Past Medical History:  Diagnosis Date   Anxiety    Arthritis    Asthma    Cataract    Depression    GERD (gastroesophageal reflux disease)    Hepatitis    Hypertension    Neuromuscular disorder ([place])    Fibromyalgia   PID (acute pelvic inflammatory disease)    Sleep apnea    no cpap    Staph infection    in late 70's -early 80's   Substance abuse ([place])     There are no problems to display for this patient.   Past Surgical History:  Procedure Laterality Date   APPENDECTOMY     CESAREAN SECTION     CHOLECYSTECTOMY     COLONOSCOPY     CYST REMOVAL NECK     cyst removed from vagina     POLYPECTOMY     staph infection surgery Right    arm     OB History   No obstetric history on file.      Home Medications    Prior to Admission medications   Medication Sig Start Date End Date Taking? Authorizing Provider  acetaminophen (TYLENOL) 325 MG tablet Take 2 tablets (650 mg total) by mouth 2 (two) times daily as needed. 08/06/22   Yes Nakia Remmers, Wells Guiles, PA-C  albuterol (PROVENTIL HFA;VENTOLIN HFA) 108 (90 BASE) MCG/ACT inhaler Inhale 2 puffs into the lungs every 6 (six) hours as needed for wheezing.   Yes [provider]  Ascorbic Acid (VITAMIN C PO) Take by mouth.   Yes [provider]  aspirin 81 MG chewable tablet Chew 81 mg by mouth daily.     Yes [provider]  busPIRone (BUSPAR) 15 MG tablet Take 15 mg by mouth 4 (four) times daily.   Yes [provider]  chlorthalidone (HYGROTON) 25 MG tablet Take 25 mg by mouth daily.   Yes [provider]  Cholecalciferol (VITAMIN D3 SUPER STRENGTH) 50 MCG (2000 UT) TABS Take by mouth.   Yes [provider]  docusate sodium (COLACE) 100 MG capsule Take 200 mg by mouth 2 (two) times daily.   Yes [provider]  Multiple Vitamin (MULTI-VITAMIN PO) Take by mouth.   Yes [provider]  Omega-3 1000 MG CAPS Take by mouth.   Yes [provider]  OXcarbazepine (TRILEPTAL) 150 MG tablet Take by mouth.   Yes [provider]  pregabalin (LYRICA) 75  MG capsule Take by mouth.   Yes [provider]  senna (SENOKOT) 8.6 MG tablet Take by mouth.   Yes [provider]    Family History Family History  Problem Relation Age of Onset   Stroke Mother    Hypertension Mother    Diabetes Other    Colon cancer Neg Hx    Esophageal cancer Neg Hx    Rectal cancer Neg Hx    Stomach cancer Neg Hx     Social History Social History   Tobacco Use   Smoking status: Never   Smokeless tobacco: Never  Substance Use Topics   Alcohol use: Not Currently    Comment: rare/social   Drug use: Yes    Frequency: 2.0 times per week    Types: Marijuana     Allergies   Sulfa antibiotics and Elemental sulfur   Review of Systems Review of Systems Per HPI  Physical Exam Triage Vital Signs ED Triage Vitals  Enc Vitals Group     BP 08/06/22 1842 134/79     Pulse Rate 08/06/22 1842 79      Resp 08/06/22 1842 20     Temp 08/06/22 1842 98.6 F (37 C)     Temp Source 08/06/22 1842 Oral     SpO2 08/06/22 1842 98 %     Weight --      Height --      Head Circumference --      Peak Flow --      Pain Score 08/06/22 1838 9     Pain Loc --      Pain Edu? --      Excl. in [place]? --    No data found.  Updated Vital Signs BP 134/79 (BP Location: Right Arm)   Pulse 79   Temp 98.6 F (37 C) (Oral)   Resp 20   SpO2 98%   Physical Exam Vitals and nursing note reviewed.  Constitutional:      Appearance: She is obese.     Comments: Chronically ill appearing. In wheelchair  HENT:     Mouth/Throat:     Pharynx: Oropharynx is clear.  Cardiovascular:     Rate and Rhythm: Normal rate and regular rhythm.     Pulses: Normal pulses.          Posterior tibial pulses are 2+ on the right side and 2+ on the left side.     Heart sounds: Normal heart sounds.  Pulmonary:     Effort: Pulmonary effort is normal. No respiratory distress.     Breath sounds: Normal breath sounds. No rhonchi or rales.  Abdominal:     General: Abdomen is protuberant.  Musculoskeletal:     Right lower leg: No edema.     Left lower leg: No edema.  Feet:     Right foot:     Skin integrity: Dry skin present.     Toenail Condition: Right toenails are abnormally thick and ingrown.     Left foot:     Skin integrity: Dry skin present.     Toenail Condition: Left toenails are abnormally thick and ingrown.     Comments: Swelling bilateral feet. Non pitting. Does not extend past ankles. No skin changes. Sensation intact distally Neurological:     Mental Status: She is oriented to person, place, and time.  Psychiatric:        Behavior: Behavior is agitated.      UC Treatments / Results  Labs (all  labs ordered are listed, but only abnormal results are displayed) Labs Reviewed - No data to display  EKG   Radiology No results found.  Procedures Procedures (including critical care time)  Medications  Ordered in UC Medications  acetaminophen (TYLENOL) tablet 650 mg (650 mg Oral Given 08/06/22 1906)    Initial Impression / Assessment and Plan / UC Course  I have reviewed the triage vital signs and the nursing notes.  Pertinent labs & imaging results that were available during my care of the patient were reviewed by me and considered in my medical decision making (see chart for details).  Long discussion with patient about possible causes of foot swelling.  Most likely due to fluid imbalance/gravity especially if she is unable to elevate and sleeps with her feet down.  Discussed ways to try and elevate the feet including pillow, chair.  Patient reports she does not have a bed she can lie flat on and only has 1 chair that she sits in.  Discussed using compression stockings to help with swelling, patient had a old pair but they were too small.  I discussed getting a pair at [facility] for only a couple dollars, patient reports she has no money and no transportation. Her grandson brought her here today but she states he would not bring her into the store or by the stockings for her.  Reports "I don't have anyone who loves me".  We discussed not using the aspirin, especially not more than once a day.  I recommend trying Tylenol instead.  Dose given in clinic, sent some to pharmacy. [facility] regarding social determinants of health.  She reports the [place] does not help her and she does not have reliable transportation there.  Supposedly has a podiatry or orthopedic appointment next week, again transportation is an issue.  Patient is frustrated that there is not more I can do for her.  We did discuss a couple things to try and I have placed a referral for social work.  Hopefully they will be able to get in contact with her and discuss these barriers to care. At this time discussed with patient there is not much more to do, if she can somehow try the compressive  stockings and elevation, this should help with her pain due to the swelling. Patient upset but somewhat agreeable  Return and ED precautions discussed.  Final Clinical Impressions(s) / UC Diagnoses   Final diagnoses:  Bilateral swelling of feet  Need for follow-up by social worker  Lack of social support  Household circumstance affecting care     Discharge Instructions      [facility] to [facility]. I recommend elevating the legs when you can. Wear compressive stockings to relieve swelling, which will in turn relieve pain  You can try tylenol twice a day if this helps with pain. I don't recommend taking the aspirin, especially not more than once a day.  Someone should reach out to you from Social Work and help you with your barriers to care.    ED Prescriptions     Medication Sig Dispense Auth. Provider   acetaminophen (TYLENOL) 325 MG tablet Take 2 tablets (650 mg total) by mouth 2 (two) times daily as needed. 30 tablet Karilynn Carranza, Wells Guiles, PA-C      PDMP not reviewed this encounter.   Kyra Leyland 08/06/22 1926

## 2022-08-06 NOTE — Discharge Instructions (Addendum)
[facility] call [facility] to schedule an appointment. I recommend elevating the legs when you can. Wear compressive stockings to relieve swelling, which will in turn relieve pain  You can try tylenol twice a day if this helps with pain. I don't recommend taking the aspirin, especially not more than once a day.  Someone should reach out to you from Social Work and help you with your barriers to care.

## 2022-09-16 ENCOUNTER — Ambulatory Visit (HOSPITAL_COMMUNITY)
Admission: EM | Admit: 2022-09-16 | Discharge: 2022-09-16 | Disposition: A | Discharge status: Home / Self Care | Payer: No Typology Code available for payment source | Attending: Family Medicine | Admitting: Family Medicine

## 2022-09-16 ENCOUNTER — Other Ambulatory Visit: Payer: Self-pay

## 2022-09-16 ENCOUNTER — Encounter (HOSPITAL_COMMUNITY): Payer: Self-pay | Admitting: *Deleted

## 2022-09-16 DIAGNOSIS — M545 Low back pain, unspecified: Secondary | ICD-10-CM | POA: Diagnosis not present

## 2022-09-16 LAB — POCT URINALYSIS DIPSTICK, ED / UC
Bilirubin Urine: NEGATIVE
Glucose, UA: NEGATIVE mg/dL
Ketones, ur: NEGATIVE mg/dL
Leukocytes,Ua: NEGATIVE
Nitrite: NEGATIVE
Protein, ur: NEGATIVE mg/dL
Specific Gravity, Urine: 1.01 (ref 1.005–1.030)
Urobilinogen, UA: 0.2 mg/dL (ref 0.0–1.0)
pH: 5.5 (ref 5.0–8.0)

## 2022-09-16 MED ORDER — IBUPROFEN 600 MG PO TABS: 600.0000 mg | ORAL_TABLET | Freq: Three times a day (TID) | ORAL | 0 refills | Status: AC | PRN

## 2022-09-16 MED ORDER — KETOROLAC TROMETHAMINE 30 MG/ML IJ SOLN
INTRAMUSCULAR | Status: AC
  Filled 2022-09-16: qty 1

## 2022-09-16 MED ORDER — KETOROLAC TROMETHAMINE 30 MG/ML IJ SOLN
30.0000 mg | Freq: Once | INTRAMUSCULAR | Status: AC
  Administered 2022-09-16: 30 mg via INTRAMUSCULAR

## 2022-09-16 NOTE — Discharge Instructions (Addendum)
The urine had a trace of blood on it, which is a very common finding and most likely does not indicate any serious illness at this time.  You have been given a shot of Toradol 30 mg today.  Take ibuprofen 600 mg--1 tab every 8 hours as needed for pain.

## 2022-09-16 NOTE — ED Provider Notes (Signed)
[place]    CSN: [id number] Arrival date & time: 09/16/22  9357      History   Chief Complaint Chief Complaint  Patient presents with   Back Pain    HPI Laura Castro is a 71 y.o. female.    Back Pain  Here for left low back pain that began sometime this morning.  It is worsened through the day.  It is in her upper lumbar region just to the left of the spine.  She has not had any dysuria.  She is concerned it might be related to a urinary infection or "her my kidneys"  Past Medical History:  Diagnosis Date   Anxiety    Arthritis    Asthma    Cataract    Depression    GERD (gastroesophageal reflux disease)    Hepatitis    Hypertension    Neuromuscular disorder ([place])    Fibromyalgia   PID (acute pelvic inflammatory disease)    Sleep apnea    no cpap    Staph infection    in late 70's -early 80's   Substance abuse ([place])     There are no problems to display for this patient.   Past Surgical History:  Procedure Laterality Date   APPENDECTOMY     CESAREAN SECTION     CHOLECYSTECTOMY     COLONOSCOPY     CYST REMOVAL NECK     cyst removed from vagina     POLYPECTOMY     staph infection surgery Right    arm     OB History   No obstetric history on file.      Home Medications    Prior to Admission medications   Medication Sig Start Date End Date Taking? Authorizing Provider  ibuprofen (ADVIL) 600 MG tablet Take 1 tablet (600 mg total) by mouth every 8 (eight) hours as needed (pain). 09/16/22  Yes Barrett Henle, MD  acetaminophen (TYLENOL) 325 MG tablet Take 2 tablets (650 mg total) by mouth 2 (two) times daily as needed. 08/06/22   Rising, Wells Guiles, PA-C  albuterol (PROVENTIL HFA;VENTOLIN HFA) 108 (90 BASE) MCG/ACT inhaler Inhale 2 puffs into the lungs every 6 (six) hours as needed for wheezing.    [provider]  Ascorbic Acid (VITAMIN C PO) Take by mouth.    [provider]  aspirin 81 MG chewable tablet Chew  81 mg by mouth daily.      [provider]  busPIRone (BUSPAR) 15 MG tablet Take 15 mg by mouth 4 (four) times daily.    [provider]  chlorthalidone (HYGROTON) 25 MG tablet Take 25 mg by mouth daily.    [provider]  Cholecalciferol (VITAMIN D3 SUPER STRENGTH) 50 MCG (2000 UT) TABS Take by mouth.    [provider]  docusate sodium (COLACE) 100 MG capsule Take 200 mg by mouth 2 (two) times daily.    [provider]  Multiple Vitamin (MULTI-VITAMIN PO) Take by mouth.    [provider]  Omega-3 1000 MG CAPS Take by mouth.    [provider]  OXcarbazepine (TRILEPTAL) 150 MG tablet Take by mouth.    [provider]  pregabalin (LYRICA) 75 MG capsule Take by mouth.    [provider]  senna (SENOKOT) 8.6 MG tablet Take by mouth.    [provider]    Family History Family History  Problem Relation Age of Onset   Stroke Mother  Hypertension Mother    Diabetes Other    Colon cancer Neg Hx    Esophageal cancer Neg Hx    Rectal cancer Neg Hx    Stomach cancer Neg Hx     Social History Social History   Tobacco Use   Smoking status: Never   Smokeless tobacco: Never  Substance Use Topics   Alcohol use: Not Currently    Comment: rare/social   Drug use: Yes    Frequency: 2.0 times per week    Types: Marijuana     Allergies   Sulfa antibiotics and Elemental sulfur   Review of Systems Review of Systems  Musculoskeletal:  Positive for back pain.     Physical Exam Triage Vital Signs ED Triage Vitals  Enc Vitals Group     BP 09/16/22 1904 135/75     Pulse Rate 09/16/22 1904 85     Resp 09/16/22 1904 18     Temp 09/16/22 1904 99.8 F (37.7 C)     Temp src --      SpO2 09/16/22 1904 98 %     Weight --      Height --      Head Circumference --      Peak Flow --      Pain Score 09/16/22 1901 10     Pain Loc --      Pain Edu? --      Excl. in [place]? --    No data  found.  Updated Vital Signs BP 135/75   Pulse 85   Temp 99.8 F (37.7 C)   Resp 18   SpO2 98%   Visual Acuity Right Eye Distance:   Left Eye Distance:   Bilateral Distance:    Right Eye Near:   Left Eye Near:    Bilateral Near:     Physical Exam Vitals reviewed.  Constitutional:      General: She is not in acute distress.    Appearance: She is not ill-appearing, toxic-appearing or diaphoretic.  Cardiovascular:     Rate and Rhythm: Normal rate and regular rhythm.     Heart sounds: No murmur heard. Pulmonary:     Effort: Pulmonary effort is normal.     Breath sounds: Normal breath sounds.  Skin:    Coloration: Skin is not jaundiced or pale.  Neurological:     Mental Status: She is alert and oriented to person, place, and time.      UC Treatments / Results  Labs (all labs ordered are listed, but only abnormal results are displayed) Labs Reviewed  POCT URINALYSIS DIPSTICK, ED / UC - Abnormal; Notable for the following components:      Result Value   Hgb urine dipstick TRACE (*)    All other components within normal limits    EKG   Radiology No results found.  Procedures Procedures (including critical care time)  Medications Ordered in UC Medications  ketorolac (TORADOL) 30 MG/ML injection 30 mg (has no administration in time range)    Initial Impression / Assessment and Plan / UC Course  I have reviewed the triage vital signs and the nursing notes.  Pertinent labs & imaging results that were available during my care of the patient were reviewed by me and considered in my medical decision making (see chart for details).        Urinalysis shows a trace of blood but no other abnormality.  Is much improved from when she had a UTI in referable symptoms  in August.  Discussed with the patient and she does feel like this began when she bent over today.  We discussed this could be pain in her muscules and bones of her back.  She is given a shot of Toradol  and a few ibuprofen 600 mg on a prescription.  Her last EGFR was normal.  She is not on any anticoagulants Final Clinical Impressions(s) / UC Diagnoses   Final diagnoses:  Acute left-sided low back pain without sciatica     Discharge Instructions      The urine had a trace of blood on it, which is a very common finding and most likely does not indicate any serious illness at this time.  You have been given a shot of Toradol 30 mg today.  Take ibuprofen 600 mg--1 tab every 8 hours as needed for pain.       ED Prescriptions     Medication Sig Dispense Auth. Provider   ibuprofen (ADVIL) 600 MG tablet Take 1 tablet (600 mg total) by mouth every 8 (eight) hours as needed (pain). 15 tablet Jorje Vanatta, Gwenlyn Perking, MD      PDMP not reviewed this encounter.   Barrett Henle, MD 09/16/22 [phone]

## 2022-09-16 NOTE — ED Triage Notes (Signed)
Pt reports back pain.  

## 2023-06-23 ENCOUNTER — Other Ambulatory Visit: Payer: Self-pay

## 2023-06-23 ENCOUNTER — Emergency Department (HOSPITAL_COMMUNITY)
Admission: EM | Admit: 2023-06-23 | Discharge: 2023-06-24 | Disposition: A | Discharge status: Other Acute Inpatient Hospital | Payer: No Typology Code available for payment source | Attending: Emergency Medicine | Admitting: Emergency Medicine

## 2023-06-23 DIAGNOSIS — Z7982 Long term (current) use of aspirin: Secondary | ICD-10-CM | POA: Diagnosis not present

## 2023-06-23 DIAGNOSIS — R45851 Suicidal ideations: Secondary | ICD-10-CM | POA: Diagnosis not present

## 2023-06-23 DIAGNOSIS — R45850 Homicidal ideations: Secondary | ICD-10-CM | POA: Insufficient documentation

## 2023-06-23 DIAGNOSIS — Z5900 Homelessness unspecified: Secondary | ICD-10-CM | POA: Insufficient documentation

## 2023-06-23 DIAGNOSIS — I10 Essential (primary) hypertension: Secondary | ICD-10-CM | POA: Diagnosis not present

## 2023-06-23 DIAGNOSIS — J45909 Unspecified asthma, uncomplicated: Secondary | ICD-10-CM | POA: Insufficient documentation

## 2023-06-23 DIAGNOSIS — F332 Major depressive disorder, recurrent severe without psychotic features: Secondary | ICD-10-CM | POA: Diagnosis present

## 2023-06-23 DIAGNOSIS — E876 Hypokalemia: Secondary | ICD-10-CM | POA: Insufficient documentation

## 2023-06-23 DIAGNOSIS — Z20822 Contact with and (suspected) exposure to covid-19: Secondary | ICD-10-CM | POA: Diagnosis not present

## 2023-06-23 LAB — SALICYLATE LEVEL: Salicylate Lvl: <7 mg/dL — ABNORMAL LOW (ref 7.0–30.0)

## 2023-06-23 LAB — RAPID URINE DRUG SCREEN, HOSP PERFORMED
Amphetamines: NOT DETECTED
Barbiturates: NOT DETECTED
Benzodiazepines: NOT DETECTED
Cocaine: NOT DETECTED
Opiates: NOT DETECTED
Tetrahydrocannabinol: POSITIVE — AB

## 2023-06-23 LAB — CBC WITH DIFFERENTIAL/PLATELET
Abs Immature Granulocytes: 0.02 10*3/uL (ref 0.00–0.07)
Basophils Absolute: 0 10*3/uL (ref 0.0–0.1)
Basophils Relative: 0 %
Eosinophils Absolute: 0.2 10*3/uL (ref 0.0–0.5)
Eosinophils Relative: 2 %
HCT: 36.4 % (ref 36.0–46.0)
Hemoglobin: 11.4 g/dL — ABNORMAL LOW (ref 12.0–15.0)
Immature Granulocytes: 0 %
Lymphocytes Relative: 24 %
Lymphs Abs: 2 10*3/uL (ref 0.7–4.0)
MCH: 26.4 pg (ref 26.0–34.0)
MCHC: 31.3 g/dL (ref 30.0–36.0)
MCV: 84.3 fL (ref 80.0–100.0)
Monocytes Absolute: 0.7 10*3/uL (ref 0.1–1.0)
Monocytes Relative: 8 %
Neutro Abs: 5.4 10*3/uL (ref 1.7–7.7)
Neutrophils Relative %: 66 %
Platelets: 297 10*3/uL (ref 150–400)
RBC: 4.32 MIL/uL (ref 3.87–5.11)
RDW: 16.6 % — ABNORMAL HIGH (ref 11.5–15.5)
WBC: 8.2 10*3/uL (ref 4.0–10.5)
nRBC: 0 % (ref 0.0–0.2)

## 2023-06-23 LAB — COMPREHENSIVE METABOLIC PANEL
ALT: 17 U/L (ref 0–44)
AST: 15 U/L (ref 15–41)
Albumin: 3.6 g/dL (ref 3.5–5.0)
Alkaline Phosphatase: 95 U/L (ref 38–126)
Anion gap: 11 (ref 5–15)
BUN: 11 mg/dL (ref 8–23)
CO2: 27 mmol/L (ref 22–32)
Calcium: 9 mg/dL (ref 8.9–10.3)
Chloride: 102 mmol/L (ref 98–111)
Creatinine, Ser: 0.79 mg/dL (ref 0.44–1.00)
GFR, Estimated: >60 mL/min (ref >60)
Glucose, Bld: 107 mg/dL — ABNORMAL HIGH (ref 70–99)
Potassium: 3.1 mmol/L — ABNORMAL LOW (ref 3.5–5.1)
Sodium: 140 mmol/L (ref 135–145)
Total Bilirubin: 0.4 mg/dL (ref 0.3–1.2)
Total Protein: 7.1 g/dL (ref 6.5–8.1)

## 2023-06-23 LAB — ACETAMINOPHEN LEVEL: Acetaminophen (Tylenol), Serum: <10 ug/mL — ABNORMAL LOW (ref 10–30)

## 2023-06-23 LAB — ETHANOL: Alcohol, Ethyl (B): <10 mg/dL (ref <10)

## 2023-06-23 LAB — SARS CORONAVIRUS 2 BY RT PCR: SARS Coronavirus 2 by RT PCR: NEGATIVE

## 2023-06-23 MED ORDER — FESOTERODINE FUMARATE ER 4 MG PO TB24
4.0000 mg | ORAL_TABLET | Freq: Every day | ORAL | Status: DC
  Administered 2023-06-23 – 2023-06-24 (×2): 4 mg via ORAL
  Filled 2023-06-23 (×2): qty 1

## 2023-06-23 MED ORDER — ARIPIPRAZOLE 5 MG PO TABS
15.0000 mg | ORAL_TABLET | Freq: Every day | ORAL | Status: DC
  Administered 2023-06-23 – 2023-06-24 (×2): 15 mg via ORAL
  Filled 2023-06-23 (×2): qty 3

## 2023-06-23 MED ORDER — TRAZODONE HCL 100 MG PO TABS: 100.0000 mg | ORAL_TABLET | Freq: Every evening | ORAL | Status: DC | PRN

## 2023-06-23 MED ORDER — ASPIRIN 81 MG PO CHEW
81.0000 mg | CHEWABLE_TABLET | Freq: Every day | ORAL | Status: DC
  Administered 2023-06-23 – 2023-06-24 (×2): 81 mg via ORAL
  Filled 2023-06-23 (×2): qty 1

## 2023-06-23 MED ORDER — POTASSIUM CHLORIDE CRYS ER 20 MEQ PO TBCR
40.0000 meq | EXTENDED_RELEASE_TABLET | Freq: Once | ORAL | Status: AC
  Administered 2023-06-23: 40 meq via ORAL
  Filled 2023-06-23: qty 2

## 2023-06-23 MED ORDER — BUSPIRONE HCL 10 MG PO TABS
15.0000 mg | ORAL_TABLET | Freq: Three times a day (TID) | ORAL | Status: DC
  Administered 2023-06-23 – 2023-06-24 (×4): 15 mg via ORAL
  Filled 2023-06-23 (×3): qty 2

## 2023-06-23 MED ORDER — VENLAFAXINE HCL ER 75 MG PO CP24
225.0000 mg | ORAL_CAPSULE | Freq: Every day | ORAL | Status: DC
  Administered 2023-06-24: 225 mg via ORAL
  Filled 2023-06-23: qty 3

## 2023-06-23 MED ORDER — PREGABALIN 50 MG PO CAPS
75.0000 mg | ORAL_CAPSULE | Freq: Two times a day (BID) | ORAL | Status: DC
  Administered 2023-06-23 – 2023-06-24 (×3): 75 mg via ORAL
  Filled 2023-06-23 (×3): qty 1

## 2023-06-23 MED ORDER — MAGNESIUM OXIDE -MG SUPPLEMENT 400 (240 MG) MG PO TABS
400.0000 mg | ORAL_TABLET | Freq: Once | ORAL | Status: AC
  Administered 2023-06-23: 400 mg via ORAL
  Filled 2023-06-23: qty 1

## 2023-06-23 MED ORDER — TRAZODONE HCL 50 MG PO TABS
75.0000 mg | ORAL_TABLET | Freq: Every evening | ORAL | Status: DC | PRN
  Administered 2023-06-23: 75 mg via ORAL
  Filled 2023-06-23: qty 2

## 2023-06-23 MED ORDER — OXCARBAZEPINE 150 MG PO TABS
150.0000 mg | ORAL_TABLET | Freq: Two times a day (BID) | ORAL | Status: DC
  Administered 2023-06-23 – 2023-06-24 (×3): 150 mg via ORAL
  Filled 2023-06-23 (×3): qty 1

## 2023-06-23 NOTE — Progress Notes (Addendum)
ADDENDUM  1:44 PM - CSW spoke with April, transfer coordinator, at [facility] via phone call. April reports that [facility] does have available beds, however due to being late afternoon, April advised this CSW to fax referral packet to AOD at [phone].  12:50 PM - CSW attempted to speak with April, transfer coordinator, at [facility] via phone call. CSW was unable to speak with April, but left a voicemail inquiring about bed availability for female [facility] inpatient treatment. CSW will await follow up.  Cathie Beams, [place]  06/23/2023 12:53 PM

## 2023-06-23 NOTE — ED Notes (Signed)
Pt wanted to speak with doctor. EDP was notified.

## 2023-06-23 NOTE — ED Notes (Addendum)
Pt belonging have been stored in [employer] labeled "31"  Belongings include her clothes along with an iPhone, Apple Watch, A ring, 8 gold bracelets, 1 ankle bracelet, 1 gold chain with pendant, black bag, and a white bag.

## 2023-06-23 NOTE — Progress Notes (Signed)
3:34 PM - CSW sent [facility] sheet, [phone], and referral packet to [facility] A[facility] (after hours) via fax [phone]. CSW will await follow up.  Cathie Beams, [place]  06/23/2023 3:36 PM

## 2023-06-23 NOTE — ED Provider Notes (Signed)
[place] EMERGENCY DEPARTMENT AT [facility] Provider Note   CSN: [id number] Arrival date & time: 06/23/23  4098     History  Chief Complaint  Patient presents with   Suicidal Ideation     Laura Castro is a 72 y.o. female. With past medical history of fibromyalgia, hypertension, GERD, asthma, arthritis, anxiety, housing instability who presents to the emergency department with suicidal ideation.  States she was at a friends house this evening when they kicked her out. She is without housing. States that at some point she ended up in a hotel parking lot with her son and got into a verbal argument with him. She called the veteran hotline because she was having thoughts about hurting herself and her son. She is tearful during interview. States that she is lonely. States that she "wanted someone nice to talk to." She does not have a plan regarding SI or HI. She denies AVH.   Per EMS, patient was removed from a hotel she was staying at with her son due to a verbal altercation. At some point the veteran hotline was contacted for suicidal ideation. She had verbalized at some point she wished to kill herself and son. On chart review, I do see she has seen the [place] for mental health but unable to see notes.   HPI     Home Medications Prior to Admission medications   Medication Sig Start Date End Date Taking? Authorizing Provider  acetaminophen (TYLENOL) 325 MG tablet Take 2 tablets (650 mg total) by mouth 2 (two) times daily as needed. 08/06/22   Rising, Lurena Joiner, PA-C  albuterol (PROVENTIL HFA;VENTOLIN HFA) 108 (90 BASE) MCG/ACT inhaler Inhale 2 puffs into the lungs every 6 (six) hours as needed for wheezing.    [provider]  Ascorbic Acid (VITAMIN C PO) Take by mouth.    [provider]  aspirin 81 MG chewable tablet Chew 81 mg by mouth daily.      [provider]  busPIRone (BUSPAR) 15 MG tablet Take 15 mg by mouth 4 (four) times daily.     [provider]  chlorthalidone (HYGROTON) 25 MG tablet Take 25 mg by mouth.    [provider]  Cholecalciferol (VITAMIN D3 SUPER STRENGTH) 50 MCG (2000 UT) TABS Take by mouth.    [provider]  docusate sodium (COLACE) 250 MG capsule Take 250 mg by mouth.    [provider]  ibuprofen (ADVIL) 600 MG tablet Take 1 tablet (600 mg total) by mouth every 8 (eight) hours as needed (pain). 09/16/22   Zenia Resides, MD  Multiple Vitamin (MULTI-VITAMIN PO) Take by mouth.    [provider]  Omega-3 1000 MG CAPS Take by mouth.    [provider]  OXcarbazepine (TRILEPTAL) 150 MG tablet Take by mouth.    [provider]  pregabalin (LYRICA) 75 MG capsule Take by mouth.    [provider]  senna (SENOKOT) 8.6 MG tablet Take by mouth.    [provider]  traZODone (DESYREL) 50 MG tablet Take 50 mg by mouth.    [provider]  trospium (SANCTURA) 20 MG tablet Take 20 mg by mouth.    [provider]      Allergies    Sulfa antibiotics and Elemental sulfur    Review of Systems   Review of Systems  Psychiatric/Behavioral:  Positive for dysphoric mood and suicidal ideas.   All other systems reviewed and are negative.  Physical Exam Updated Vital Signs BP 127/75   Pulse 89   Temp 98.5 F (36.9 C) (Oral)   Resp 18   SpO2 98%  Physical Exam Vitals and nursing note reviewed.  Constitutional:      Appearance: She is obese. She is not ill-appearing.     Comments: Tearful during interview   HENT:     Head: Normocephalic and atraumatic.  Eyes:     General: No scleral icterus.    Extraocular Movements: Extraocular movements intact.  Cardiovascular:     Rate and Rhythm: Normal rate and regular rhythm.     Pulses: Normal pulses.  Pulmonary:     Effort: Pulmonary effort is normal. No respiratory distress.  Abdominal:     Palpations: Abdomen is soft.  Skin:    General: Skin is warm and dry.      Findings: No rash.  Neurological:     General: No focal deficit present.     Mental Status: She is alert and oriented to person, place, and time.  Psychiatric:        Attention and Perception: She does not perceive auditory or visual hallucinations.        Mood and Affect: Mood is depressed. Affect is tearful.        Speech: Speech normal.        Behavior: Behavior is withdrawn. Behavior is cooperative.        Thought Content: Thought content includes homicidal and suicidal ideation. Thought content does not include homicidal or suicidal plan.        Cognition and Memory: Cognition normal.        Judgment: Judgment is impulsive and inappropriate.     ED Results / Procedures / Treatments   Labs (all labs ordered are listed, but only abnormal results are displayed) Labs Reviewed  COMPREHENSIVE METABOLIC PANEL - Abnormal; Notable for the following components:      Result Value   Potassium 3.1 (*)    Glucose, Bld 107 (*)    All other components within normal limits  CBC WITH DIFFERENTIAL/PLATELET - Abnormal; Notable for the following components:   Hemoglobin 11.4 (*)    RDW 16.6 (*)    All other components within normal limits  ACETAMINOPHEN LEVEL - Abnormal; Notable for the following components:   Acetaminophen (Tylenol), Serum <10 (*)    All other components within normal limits  SALICYLATE LEVEL - Abnormal; Notable for the following components:   Salicylate Lvl <7.0 (*)    All other components within normal limits  ETHANOL  RAPID URINE DRUG SCREEN, HOSP PERFORMED    EKG EKG Interpretation Date/Time:  Monday June 23 2023 08:39:14 EDT Ventricular Rate:  81 PR Interval:  184 QRS Duration:  82 QT Interval:  384 QTC Calculation: 446 R Axis:   78  Text Interpretation: Normal sinus rhythm Normal ECG When compared with ECG of 23-Jun-2023 08:38, No old tracing to compare Confirmed by Meridee Score [id number]) on 06/23/2023 8:40:26 AM  Radiology No results  found.  Procedures Procedures   Medications Ordered in ED Medications  potassium chloride SA (KLOR-CON M) CR tablet 40 mEq (has no administration in time range)  magnesium oxide (MAG-OX) tablet 400 mg (has no administration in time range)    ED Course/ Medical Decision Making/ A&P   {   Medical Decision Making Amount and/or Complexity of Data Reviewed Labs: ordered.  Risk OTC drugs. Prescription drug management.  Initial Impression and Ddx 72 year old female who presents to the emergency  department with suicidal ideation.  Patient PMH that increases complexity of ED encounter:  anxiety, fibromyalgia, GERD, hypertension.  Interpretation of Diagnostics I independent reviewed and interpreted the labs as followed: Tox screening negative.  Potassium is 3.1.  - I independently visualized the following imaging with scope of interpretation limited to determining acute life threatening conditions related to emergency care: Not indicated  Patient Reassessment and Ultimate Disposition/Management 72 year old female who presents to the emergency department for SI/HI. On exam and interview, she is tearful and withdrawn. Does endorse SI/HI.  At this time, will order med clearance. I think she would benefit from talking with behavioral health here in ED. Will not IVC at this time as she is okay staying voluntary. Will IVC her if she attempts to leave.  Med clearance labs show hypokalemia at 3.1.  This was replaced orally.  Toxicology screening is negative.  Patient is medically cleared for TTS evaluation.  Continues to be voluntary and cooperative with assessment.  Would likely need IVC if she does try to leave given SI and HI.  Appreciate psych recommendations.  Patient management required discussion with the following services or consulting groups:  Psychiatry/TTS  Complexity of Problems Addressed Acute complicated illness or Injury  Additional Data Reviewed and Analyzed Further  history obtained from: EMS on arrival, Past medical history and medications listed in the EMR, and Care Everywhere  Patient Encounter Risk Assessment SDOH impact on management and Consideration of hospitalization  Final Clinical Impression(s) / ED Diagnoses Final diagnoses:  Suicidal ideation    Rx / DC Orders ED Discharge Orders     None         Cristopher Peru, PA-C 06/23/23 1059    Terrilee Files, MD 06/23/23 1719

## 2023-06-23 NOTE — Consult Note (Signed)
BH ED ASSESSMENT   Reason for Consult:  Psychiatry evaluation Referring Physician:  ER Physician Patient Identification: Laura Castro MRN:  [id number] ED Chief Complaint: Major depressive disorder, recurrent severe without psychotic features (HCC)  Diagnosis:  Principal Problem:   Major depressive disorder, recurrent severe without psychotic features [facility])   ED Assessment Time Calculation: Start Time: 1132 Stop Time: 1211 Total Time in Minutes (Assessment Completion): 39   Subjective:   Laura Castro is a 72 y.o. female patient admitted with previous hx of Depression, anxiety and PTSD, housing instability , Fibromyalgia and Morbid obesity.was brought in the the ER by EMS from an extended stay hotel after an argument with her son.  Patient is homeless and moves from place to place and in the process lost her W/C and walker.    HPI:  Patient was seen by this provider this morning angry, feels hopeless and helpless.  Patient is suicidal stating"  I want to go to sleep and not wake up.  Life is nothing for me, this is not a way to live life"  Patient also added she will not kill herself by her self but want to die by any means.  Patient became tearful stating that she is tired of staying with people who takes her money for rent and sends her away.  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  Her Psychiatric provider left and she is yet to be assigned a new one.  She does not remember list of all of her medications.  Patient reports poor sleep and appetite.  She has hx of sleep apnea and has no CPAP Machine.  Patient is upset and angry stating she and her son always have arguments and that the relationship is not the best.  Patient states she cannot stay in a shelter because she cannot do much for her self.  She was hospitalized at [facility] ago and [facility]. [facility] Nurse for DR Bunnie Philips and informed her that this patient is here needing mental healthcare. Medication list needed were faxed to the [facility] long ER.  Patient meets criteria for inpatient Psychiatry hospitalization.  We will resume home Medications and fax out records for inpatient Mental health hospitalizations.  Patient denies HI/AVH but remains suicidal.  Past Psychiatric History:  hx of Depression, anxiety and PTSD, housing instability , Fibromyalgia and Morbid obesity.  Receives care at [facility] clinic.  Risk to Self or Others: Is the patient at risk to self? Yes Has the patient been a risk to self in the past 6 months? Yes Has the patient been a risk to self within the distant past? Yes Is the patient a risk to others? No Has the patient been a risk to others in the past 6 months? No Has the patient been a risk to others within the distant past? No  [place] Scale:  Flowsheet Row ED from 06/23/2023 in [facility] Emergency Department at [facility] ED from 09/16/2022 in [facility] Urgent Care at [facility] ED from 08/06/2022 in [facility] Care at [facility] RISK CATEGORY High Risk No Risk No Risk       AIMS:  , , ,  ,   ASAM:    Substance Abuse:     Past  Medical History:  Past Medical History:  Diagnosis Date   Anxiety    Arthritis    Asthma    Cataract    Depression    GERD (gastroesophageal reflux disease)    Hepatitis    Hypertension    Neuromuscular disorder (HCC)    Fibromyalgia   PID (acute pelvic inflammatory disease)    Sleep apnea    no cpap    Staph infection    in late 70's -early 80's   Substance abuse (HCC)     Past Surgical History:  Procedure Laterality Date   APPENDECTOMY     CESAREAN SECTION     CHOLECYSTECTOMY     COLONOSCOPY     CYST REMOVAL NECK     cyst removed from vagina     POLYPECTOMY     staph infection surgery Right     arm    Family History:  Family History  Problem Relation Age of Onset   Stroke Mother    Hypertension Mother    Diabetes Other    Colon cancer Neg Hx    Esophageal cancer Neg Hx    Rectal cancer Neg Hx    Stomach cancer Neg Hx    Family Psychiatric  History: unknwn Social History:  Social History   Substance and Sexual Activity  Alcohol Use Not Currently   Comment: rare/social     Social History   Substance and Sexual Activity  Drug Use Yes   Frequency: 2.0 times per week   Types: Marijuana    Social History   Socioeconomic History   Marital status: Divorced    Spouse name: Not on file   Number of children: Not on file   Years of education: Not on file   Highest education level: Not on file  Occupational History   Not on file  Tobacco Use   Smoking status: Never   Smokeless tobacco: Never  Vaping Use   Vaping status: Not on file  Substance and Sexual Activity   Alcohol use: Not Currently    Comment: rare/social   Drug use: Yes    Frequency: 2.0 times per week    Types: Marijuana   Sexual activity: Not on file  Other Topics Concern   Not on file  Social History Narrative   Not on file   Social Determinants of Health   Financial Resource Strain: Not on file  Food Insecurity: Not on file  Transportation Needs: Not on file  Physical Activity: Not on file  Stress: Not on file  Social Connections: Unknown (03/29/2022)   [facility]   Social Network    Social Network: Not on file   Additional Social History:    Allergies:   Allergies  Allergen Reactions   Sulfa Antibiotics Rash and Swelling   Elemental Sulfur Rash    Labs:  Results for orders placed or performed during the hospital encounter of 06/23/23 (from the past 48 hour(s))  Comprehensive metabolic panel     Status: Abnormal   Collection Time: 06/23/23 10:20 AM  Result Value Ref Range   Sodium 140 135 - 145 mmol/L   Potassium 3.1 (L) 3.5 - 5.1 mmol/L    Chloride 102 98 - 111 mmol/L   CO2 27 22 - 32 mmol/L   Glucose, Bld 107 (H) 70 - 99 mg/dL    Comment: Glucose reference range applies only to samples taken after fasting for at least 8 hours.   BUN 11 8 -  23 mg/dL   Creatinine, Ser 1.61 0.44 - 1.00 mg/dL   Calcium 9.0 8.9 - 09.6 mg/dL   Total Protein 7.1 6.5 - 8.1 g/dL   Albumin 3.6 3.5 - 5.0 g/dL   AST 15 15 - 41 U/L   ALT 17 0 - 44 U/L   Alkaline Phosphatase 95 38 - 126 U/L   Total Bilirubin 0.4 0.3 - 1.2 mg/dL   GFR, Estimated >04 >54 mL/min    Comment: (NOTE) Calculated using the CKD-EPI Creatinine Equation (2021)    Anion gap 11 5 - 15    Comment: Performed at [facility], [address], [place], [place] [address]  Ethanol     Status: None   Collection Time: 06/23/23 10:20 AM  Result Value Ref Range   Alcohol, Ethyl (B) <10 <10 mg/dL    Comment: (NOTE) Lowest detectable limit for serum alcohol is 10 mg/dL.  For medical purposes only. Performed at [facility], [address], [place], [place] [address]   CBC with Diff     Status: Abnormal   Collection Time: 06/23/23 10:20 AM  Result Value Ref Range   WBC 8.2 4.0 - 10.5 K/uL   RBC 4.32 3.87 - 5.11 MIL/uL   Hemoglobin 11.4 (L) 12.0 - 15.0 g/dL   HCT 29.5 62.1 - 30.8 %   MCV 84.3 80.0 - 100.0 fL   MCH 26.4 26.0 - 34.0 pg   MCHC 31.3 30.0 - 36.0 g/dL   RDW 65.7 (H) 84.6 - 96.2 %   Platelets 297 150 - 400 K/uL   nRBC 0.0 0.0 - 0.2 %   Neutrophils Relative % 66 %   Neutro Abs 5.4 1.7 - 7.7 K/uL   Lymphocytes Relative 24 %   Lymphs Abs 2.0 0.7 - 4.0 K/uL   Monocytes Relative 8 %   Monocytes Absolute 0.7 0.1 - 1.0 K/uL   Eosinophils Relative 2 %   Eosinophils Absolute 0.2 0.0 - 0.5 K/uL   Basophils Relative 0 %   Basophils Absolute 0.0 0.0 - 0.1 K/uL   Immature Granulocytes 0 %   Abs Immature Granulocytes 0.02 0.00 - 0.07 K/uL    Comment: Performed at [facility], [address] [place], [place] [address]   Acetaminophen level     Status: Abnormal   Collection Time: 06/23/23 10:20 AM  Result Value Ref Range   Acetaminophen (Tylenol), Serum <10 (L) 10 - 30 ug/mL    Comment: (NOTE) Therapeutic concentrations vary significantly. A range of 10-30 ug/mL  may be an effective concentration for many patients. However, some  are best treated at concentrations outside of this range. Acetaminophen concentrations >150 ug/mL at 4 hours after ingestion  and >50 ug/mL at 12 hours after ingestion are often associated with  toxic reactions.  Performed at [facility], [address], [place], [place] [address]   Salicylate level     Status: Abnormal   Collection Time: 06/23/23 10:20 AM  Result Value Ref Range   Salicylate Lvl <7.0 (L) 7.0 - 30.0 mg/dL    Comment: Performed at [facility], [address] [place], [place] [address]    Current Facility-Administered Medications  Medication Dose Route Frequency Provider Last Rate Last Admin   ARIPiprazole (ABILIFY) tablet 15 mg  15 mg Oral Daily Darrius Montano C, NP       busPIRone (BUSPAR) tablet 15 mg  15 mg Oral TID Earney Navy, NP  magnesium oxide (MAG-OX) tablet 400 mg  400 mg Oral Once Autry, Lorriane Shire, PA-C       OXcarbazepine (TRILEPTAL) tablet 150 mg  150 mg Oral BID Charidy Cappelletti C, NP       potassium chloride SA (KLOR-CON M) CR tablet 40 mEq  40 mEq Oral Once Cristopher Peru, PA-C       traZODone (DESYREL) tablet 75 mg  75 mg Oral QHS PRN Earney Navy, NP       [START ON 06/24/2023] venlafaxine XR (EFFEXOR-XR) 24 hr capsule 225 mg  225 mg Oral Q breakfast Dahlia Byes C, NP       Current Outpatient Medications  Medication Sig Dispense Refill   acetaminophen (TYLENOL) 325 MG tablet Take 2 tablets (650 mg total) by mouth 2 (two) times daily as needed. 30 tablet 2   albuterol (PROVENTIL HFA;VENTOLIN HFA) 108 (90 BASE) MCG/ACT inhaler Inhale 2 puffs into the lungs every 6 (six)  hours as needed for wheezing.     Ascorbic Acid (VITAMIN C PO) Take by mouth.     aspirin 81 MG chewable tablet Chew 81 mg by mouth daily.       busPIRone (BUSPAR) 15 MG tablet Take 15 mg by mouth 4 (four) times daily.     chlorthalidone (HYGROTON) 25 MG tablet Take 25 mg by mouth.     Cholecalciferol (VITAMIN D3 SUPER STRENGTH) 50 MCG (2000 UT) TABS Take by mouth.     docusate sodium (COLACE) 250 MG capsule Take 250 mg by mouth.     ibuprofen (ADVIL) 600 MG tablet Take 1 tablet (600 mg total) by mouth every 8 (eight) hours as needed (pain). 15 tablet 0   Multiple Vitamin (MULTI-VITAMIN PO) Take by mouth.     Omega-3 1000 MG CAPS Take by mouth.     OXcarbazepine (TRILEPTAL) 150 MG tablet Take by mouth.     pregabalin (LYRICA) 75 MG capsule Take by mouth.     senna (SENOKOT) 8.6 MG tablet Take by mouth.     traZODone (DESYREL) 50 MG tablet Take 50 mg by mouth.     trospium (SANCTURA) 20 MG tablet Take 20 mg by mouth.      Musculoskeletal: Strength & Muscle Tone:  seen in bed, utilizes walker/cane/W/C Gait & Station:  See above Patient leans:  see above   Psychiatric Specialty Exam: Presentation  General Appearance:  Casual; Disheveled; Other (comment) (Morbidly obese)  Eye Contact: Fleeting  Speech: Clear and Coherent; Normal Rate  Speech Volume: Normal  Handedness: Right   Mood and Affect  Mood: Depressed; Angry; Irritable  Affect: Congruent; Tearful   Thought Process  Thought Processes: Coherent; Goal Directed  Descriptions of Associations:Intact  Orientation:Full (Time, Place and Person)  Thought Content:Logical  History of Schizophrenia/Schizoaffective disorder:No data recorded Duration of Psychotic Symptoms:No data recorded Hallucinations:Hallucinations: None  Ideas of Reference:None  Suicidal Thoughts:Suicidal Thoughts: Yes, Active SI Active Intent and/or Plan: Without Plan  Homicidal Thoughts:Homicidal Thoughts: No   Sensorium   Memory: Immediate Good; Recent Good; Remote Good  Judgment: Intact  Insight: Good   Executive Functions  Concentration: Good  Attention Span: Good  Recall: Good  Fund of Knowledge: Good  Language: Good   Psychomotor Activity  Psychomotor Activity: Psychomotor Activity: Normal   Assets  Assets: Communication Skills; Desire for Improvement; Social Support    Sleep  Sleep: Sleep: Poor   Physical Exam: Physical Exam Vitals and nursing note reviewed.  Constitutional:      Appearance: Normal  appearance. She is obese.  HENT:     Nose: Nose normal.  Cardiovascular:     Rate and Rhythm: Normal rate and regular rhythm.  Pulmonary:     Effort: Pulmonary effort is normal.  Musculoskeletal:     Cervical back: Normal range of motion.     Comments: Poor mobility, joint pain, utilizes Walker or W/C.  Skin:    General: Skin is dry.  Neurological:     Mental Status: She is alert and oriented to person, place, and time.  Psychiatric:        Attention and Perception: Attention and perception normal.        Mood and Affect: Mood is anxious and depressed. Affect is angry and tearful.        Speech: Speech normal.        Behavior: Behavior is cooperative.        Thought Content: Thought content includes suicidal ideation.        Cognition and Memory: Cognition and memory normal.        Judgment: Judgment is inappropriate.    Review of Systems  Constitutional:        Morbid obesity, disheveled, unkempt  HENT: Negative.    Respiratory:         Sleep apnea  Cardiovascular: Negative.   Musculoskeletal:  Positive for back pain, joint pain and myalgias.       Short ambulation with walker, utilizes W/C  Skin: Negative.   Neurological: Negative.   Endo/Heme/Allergies: Negative.   Psychiatric/Behavioral:  Positive for depression and suicidal ideas. The patient is nervous/anxious and has insomnia.    Blood pressure 127/75, pulse 89, temperature 98.5 F (36.9 C),  temperature source Oral, resp. rate 18, SpO2 98%. There is no height or weight on file to calculate BMI.  Medical Decision Making: Patient meets criteria for inpatient Psychiatry hospitalization.  We will fax out records for bed placement including seeking bed at [facility].  List of medications gotten from [place] clinic and home medications to resume.  Problem 1: Recurrent Major Depressive disorder, severe without Psychotic features.  Problem 2: Suicide ideation.  Disposition:  admit, seek bed placement.  Earney Navy, NP 06/23/2023 12:11 PM

## 2023-06-23 NOTE — Consult Note (Signed)
Patient refused to speak with Provider at this time.  She answered two questions with " I don't know" then asked provider to leave.  Will try again later.

## 2023-06-23 NOTE — Progress Notes (Signed)
This CSW called [place] [phone] and spoke with the AOD Alician who informed that there is no packet found. This CSW informed that CSW will send referral via EPIC and will follow up back up.   Maryjean Ka, MSW, [facility] 06/23/2023 8:57 PM

## 2023-06-23 NOTE — ED Triage Notes (Signed)
EMS reports pt was removed from the hotel that was staying with the son due to argumentative behavior from pt to the son  and veteran hotline was contacted for suicidal ideation. Pt is homeless BP 160 palpated  99.3 Temp 02 96 Rep 16  Pt verbalized wish to kill her self and son tonight. While in the ED

## 2023-06-24 MED ORDER — DICLOFENAC SODIUM 1 % EX GEL
4.0000 g | Freq: Four times a day (QID) | CUTANEOUS | Status: DC
  Administered 2023-06-24 (×3): 4 g via TOPICAL
  Filled 2023-06-24: qty 100

## 2023-06-24 MED ORDER — DICLOFENAC SODIUM 1 % EX GEL
4.0000 g | Freq: Four times a day (QID) | CUTANEOUS | Status: DC
  Filled 2023-06-24: qty 100

## 2023-06-24 NOTE — ED Provider Notes (Signed)
Emergency Medicine Observation Re-evaluation Note  Laura Castro is a 72 y.o. female, seen on rounds today.  Pt initially presented to the ED for complaints of Suicidal Ideation  Currently, the patient is sleeping.  Physical Exam  BP 108/63 (BP Location: Left Arm)   Pulse 78   Temp 98.4 F (36.9 C) (Oral)   Resp 20   SpO2 98%  Physical Exam General: No acute distress Cardiac: Normal rate Lungs: No increased work of breathing Psych: Calm  ED Course / MDM  EKG:EKG Interpretation Date/Time:  Monday June 23 2023 08:39:14 EDT Ventricular Rate:  81 PR Interval:  184 QRS Duration:  82 QT Interval:  384 QTC Calculation: 446 R Axis:   78  Text Interpretation: Normal sinus rhythm Normal ECG When compared with ECG of 23-Jun-2023 08:38, No old tracing to compare Confirmed by Meridee Score [phone]) on 06/23/2023 8:40:26 AM  I have reviewed the labs performed to date as well as medications administered while in observation.  Recent changes in the last 24 hours include none.  Plan  Current plan is for patient has been admitted to the [facility] and will likely go today.Rolan Bucco, MD 06/24/23 [phone]

## 2023-06-24 NOTE — Progress Notes (Signed)
This CSW called back and spoke with AOD with [facility] and was informed to have 1st shift follow up, and referral will be reviewed in the morning. CSW will share with Disposition team to follow up.    Maryjean Ka, MSW, LCSWA 06/24/2023 1:25 AM

## 2023-06-24 NOTE — ED Provider Notes (Signed)
Patient was seen on rounds earlier this morning.  Patient has been accepted to the [facility] for inpatient psychiatric treatment.  Patient medically stable for transfer   Linwood Dibbles, MD 06/24/23 1654

## 2023-06-24 NOTE — ED Notes (Signed)
[facility]

## 2023-06-24 NOTE — Progress Notes (Signed)
Pt has been accepted to [facility] TODAY 06/24/2023. Bed assignment: Building 8, 1st floor, [address].  Pt meets inpatient criteria per Dahlia Byes, NP  Attending Physician will be Beckey Downing, MD  Report can be called to: [phone] ext. [phone] or [phone]  Pt can arrive anytime; bed is ready now  Care Team Notified: Dahlia Byes, NP and Marcelo Baldy, RN  Cathie Beams, LCSW  06/24/2023 9:29 AM

## 2023-06-24 NOTE — Progress Notes (Signed)
8:20 AM - CSW sent referral packet to [facility] via fax for review. CSW will await follow up.  Cathie Beams, LCSW  06/24/2023 8:20 AM

## 2023-06-24 NOTE — ED Notes (Signed)
[employer] called for [employer]

## 2023-08-12 IMAGING — CR DG LUMBAR SPINE COMPLETE 4+V
5 series · 5 of 5 positions shown · non-contrast
Comparison: None.

CLINICAL DATA: back pain

EXAM:
LUMBAR SPINE - COMPLETE 4+ VIEW

[t lumbar spine ap]
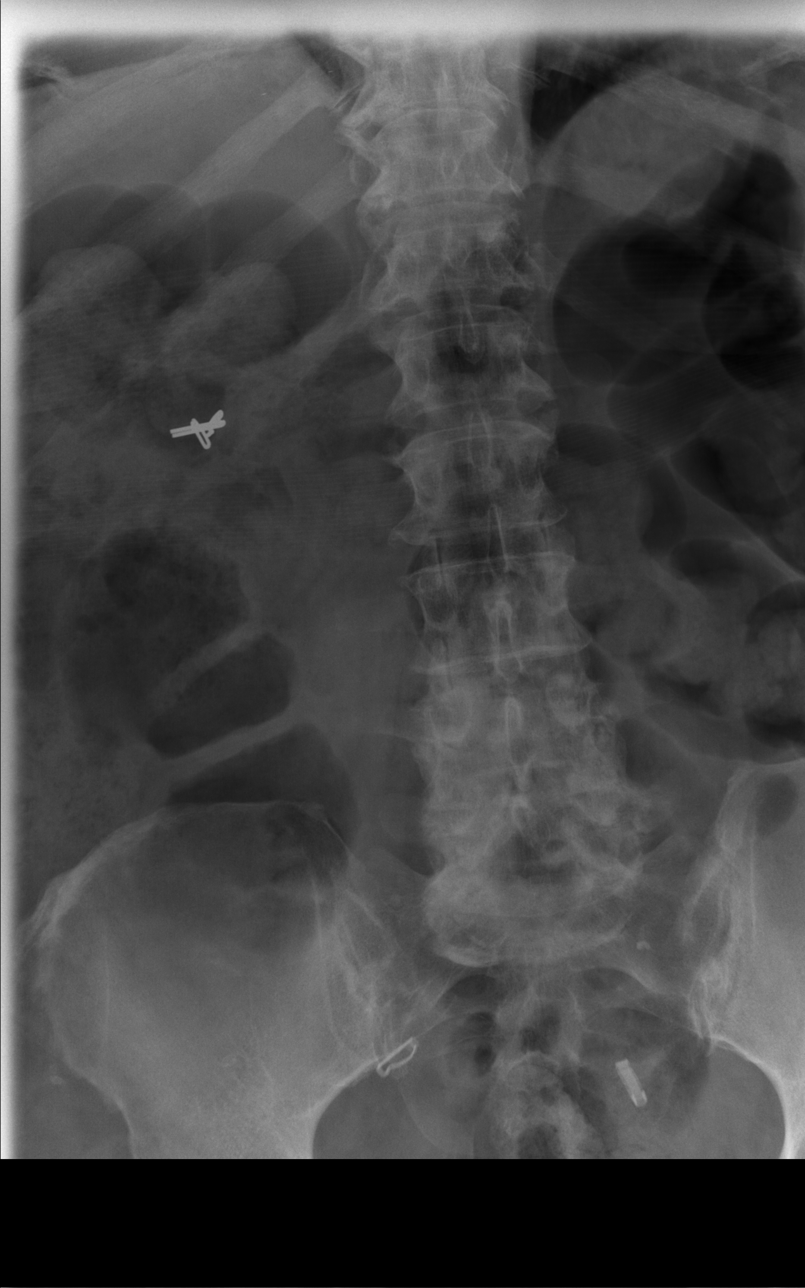

[t lumbar spine obl (1 of 2)]
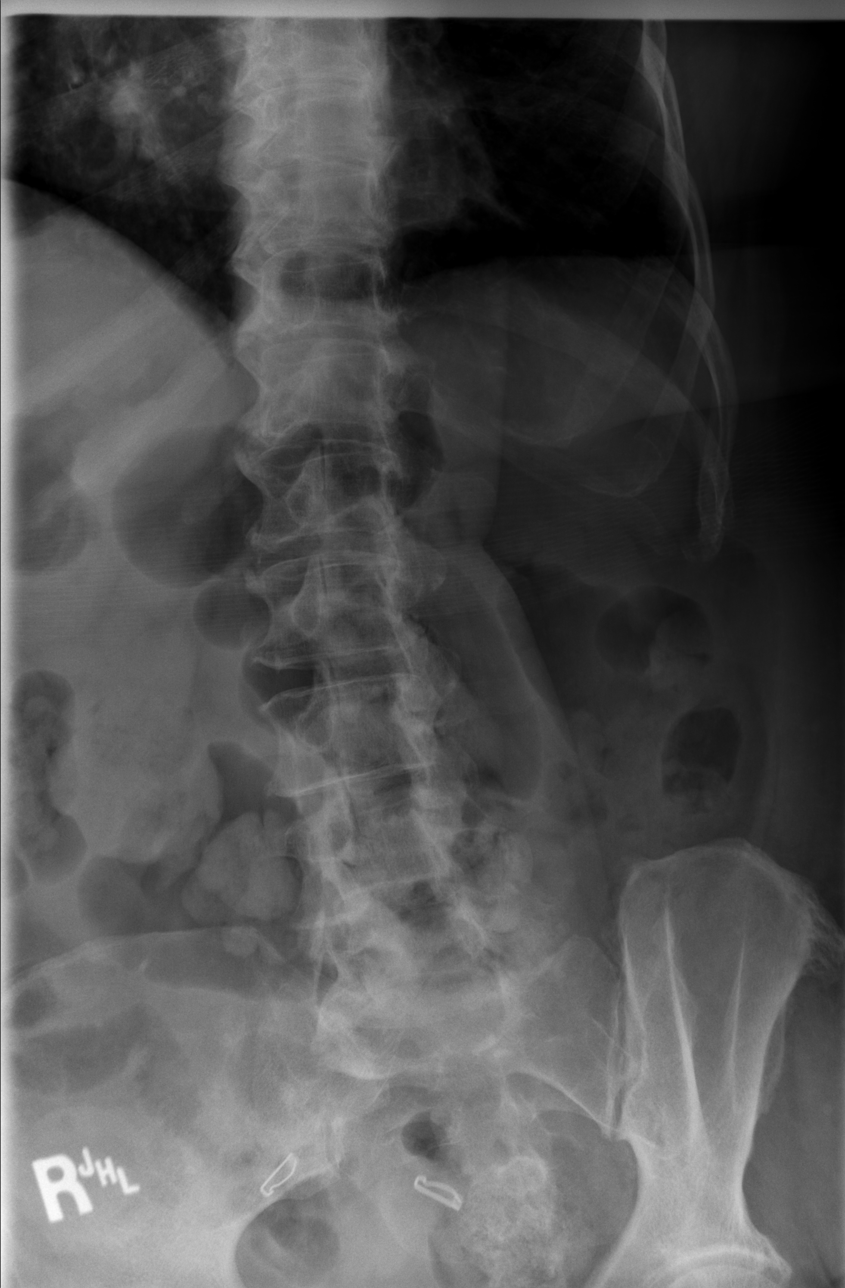

[t lumbar spine obl (2 of 2)]
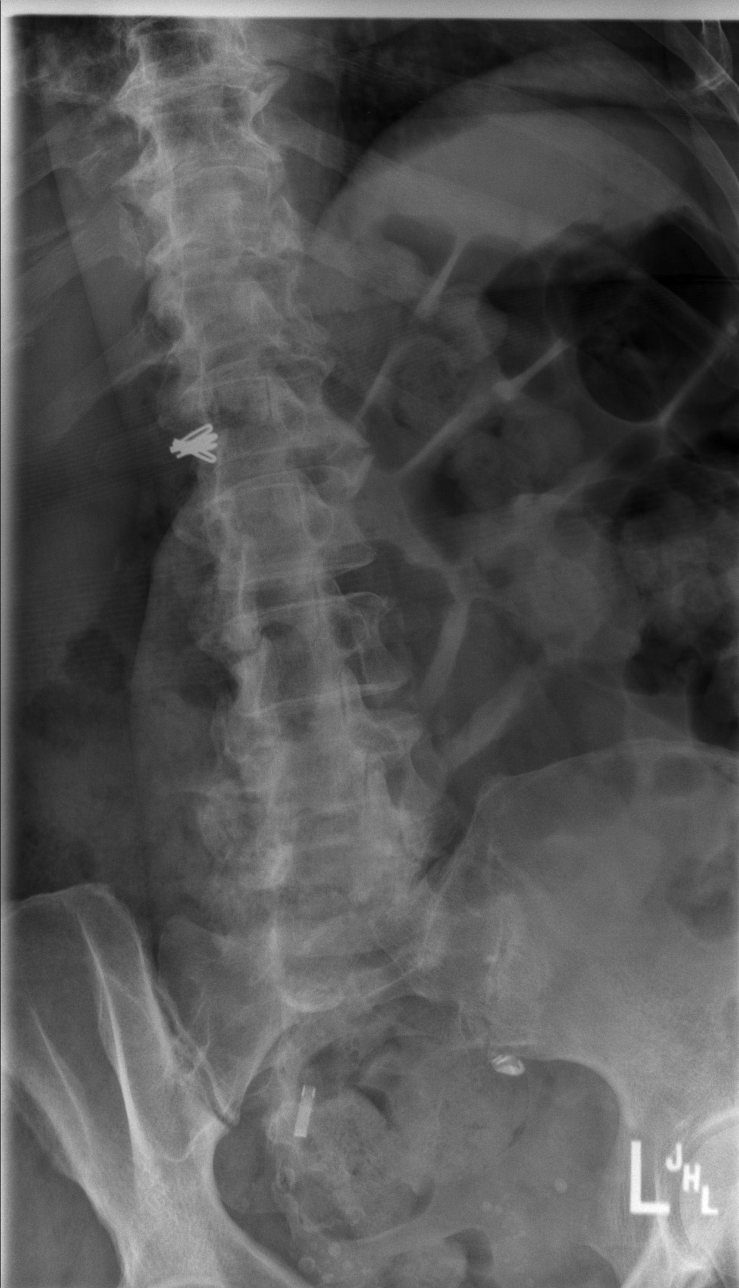

[t lumbar spine lat]
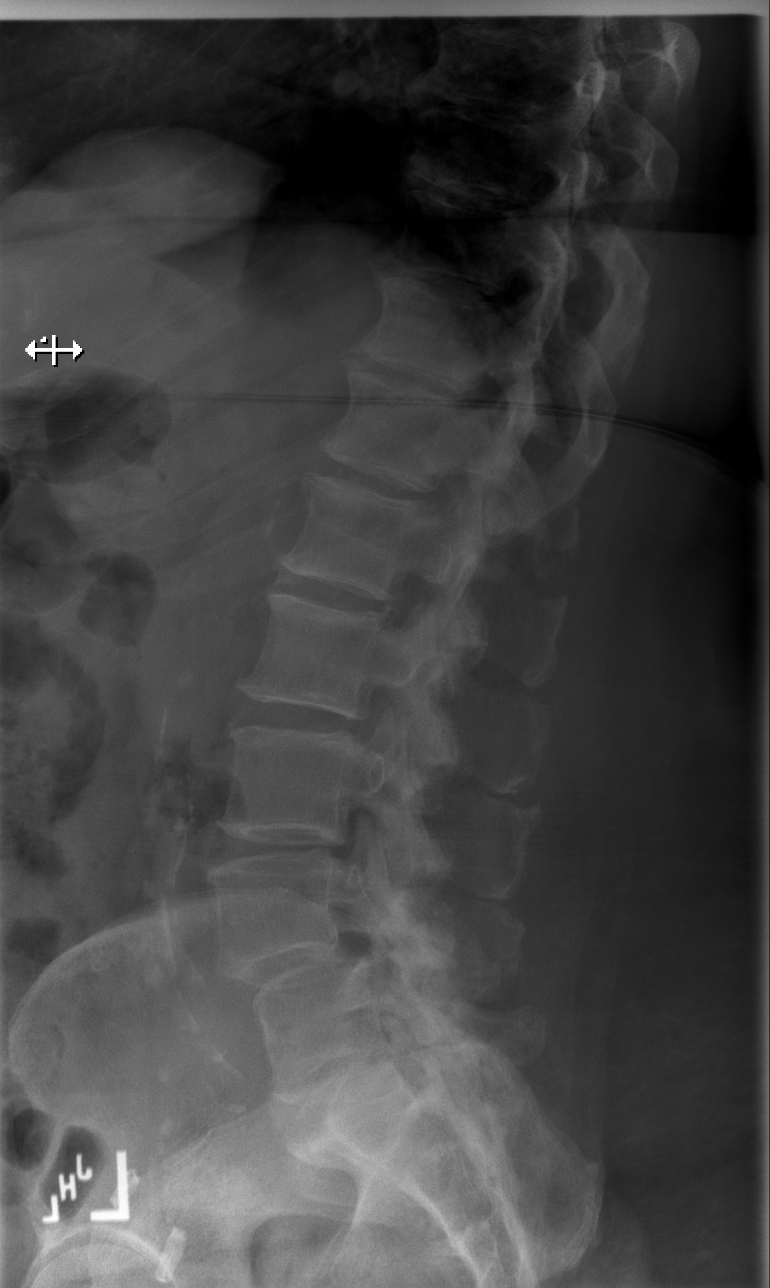

[t lumbar l-5 s-1 spot]
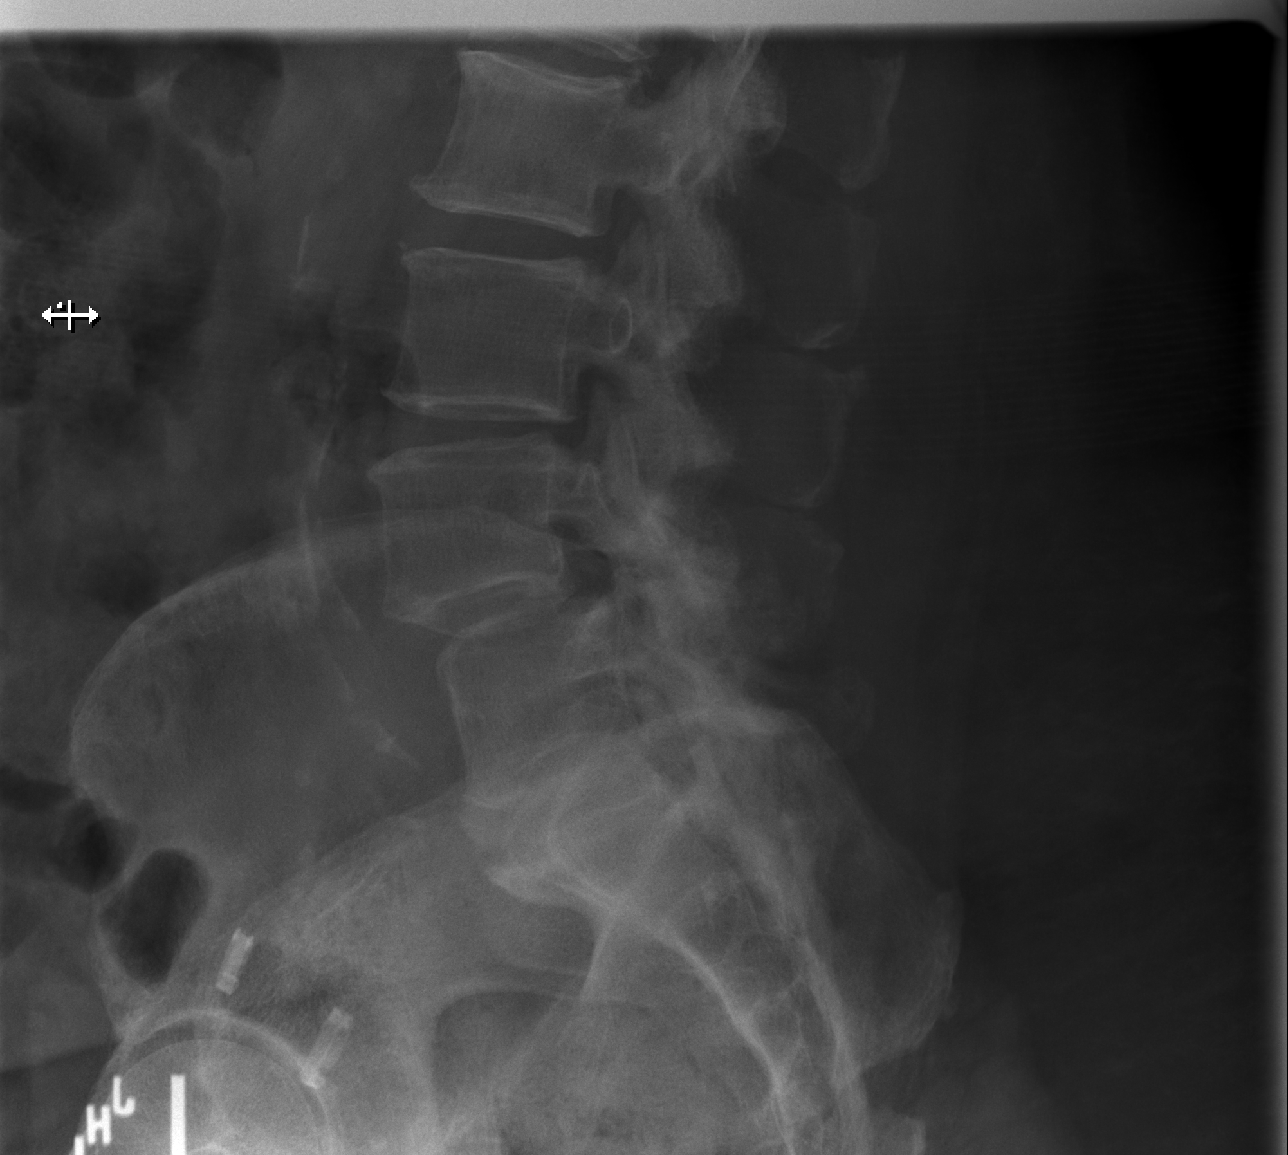

[5 of 5 positions shown; findings below may reference images not displayed]

FINDINGS: Five non-rib-bearing lumbar vertebral bodies.

There is no evidence of lumbar spine fracture. Grade 1
anterolisthesis of L4 on L5. Facet arthropathy and osteophyte
formation of the lumbar spine. Intervertebral disc space narrowing
at the L4-L5 L5-S1 level. Thoracic degenerative changes noted.

Atherosclerotic plaque.

Tubal ligation clips overlie the pelvis. Right upper quadrant
surgical clips noted.
IMPRESSION: 1. No acute displaced fracture or traumatic listhesis of the lumbar
spine in a patient with grade 1 anterolisthesis of L4 on L5.
2.  Aortic Atherosclerosis ([id number]).
